# Patient Record
Sex: Male | Born: 1945 | Race: Black or African American | Hispanic: No | Marital: Married | Smoking: Never smoker
Health system: Southern US, Community
[De-identification: ages and names within clinical notes are randomized; demographics above are authoritative.]

## PROBLEM LIST (undated history)

## (undated) DIAGNOSIS — I10 Essential (primary) hypertension: Secondary | ICD-10-CM

## (undated) DIAGNOSIS — E119 Type 2 diabetes mellitus without complications: Secondary | ICD-10-CM

## (undated) DIAGNOSIS — N189 Chronic kidney disease, unspecified: Secondary | ICD-10-CM

## (undated) DIAGNOSIS — I739 Peripheral vascular disease, unspecified: Secondary | ICD-10-CM

## (undated) DIAGNOSIS — K922 Gastrointestinal hemorrhage, unspecified: Secondary | ICD-10-CM

## (undated) DIAGNOSIS — C61 Malignant neoplasm of prostate: Secondary | ICD-10-CM

## (undated) DIAGNOSIS — E785 Hyperlipidemia, unspecified: Secondary | ICD-10-CM

## (undated) DIAGNOSIS — I2510 Atherosclerotic heart disease of native coronary artery without angina pectoris: Secondary | ICD-10-CM

## (undated) HISTORY — PX: APPENDECTOMY: SHX54

## (undated) HISTORY — PX: CHOLECYSTECTOMY: SHX55

---

## 2019-11-05 ENCOUNTER — Inpatient Hospital Stay (HOSPITAL_COMMUNITY)
Admission: AD | Admit: 2019-11-05 | Discharge: 2019-12-10 | DRG: 870 | Disposition: E | Payer: Medicare Other | Source: Other Acute Inpatient Hospital | Attending: Internal Medicine | Admitting: Internal Medicine

## 2019-11-05 ENCOUNTER — Inpatient Hospital Stay (HOSPITAL_COMMUNITY): Payer: Medicare Other

## 2019-11-05 DIAGNOSIS — K449 Diaphragmatic hernia without obstruction or gangrene: Secondary | ICD-10-CM | POA: Diagnosis present

## 2019-11-05 DIAGNOSIS — J80 Acute respiratory distress syndrome: Secondary | ICD-10-CM | POA: Diagnosis present

## 2019-11-05 DIAGNOSIS — R34 Anuria and oliguria: Secondary | ICD-10-CM | POA: Diagnosis present

## 2019-11-05 DIAGNOSIS — Z515 Encounter for palliative care: Secondary | ICD-10-CM | POA: Diagnosis not present

## 2019-11-05 DIAGNOSIS — K922 Gastrointestinal hemorrhage, unspecified: Secondary | ICD-10-CM | POA: Diagnosis not present

## 2019-11-05 DIAGNOSIS — Z8546 Personal history of malignant neoplasm of prostate: Secondary | ICD-10-CM

## 2019-11-05 DIAGNOSIS — I2510 Atherosclerotic heart disease of native coronary artery without angina pectoris: Secondary | ICD-10-CM | POA: Diagnosis present

## 2019-11-05 DIAGNOSIS — N179 Acute kidney failure, unspecified: Secondary | ICD-10-CM | POA: Diagnosis present

## 2019-11-05 DIAGNOSIS — D631 Anemia in chronic kidney disease: Secondary | ICD-10-CM | POA: Diagnosis present

## 2019-11-05 DIAGNOSIS — I130 Hypertensive heart and chronic kidney disease with heart failure and stage 1 through stage 4 chronic kidney disease, or unspecified chronic kidney disease: Secondary | ICD-10-CM | POA: Diagnosis present

## 2019-11-05 DIAGNOSIS — E1165 Type 2 diabetes mellitus with hyperglycemia: Secondary | ICD-10-CM | POA: Diagnosis not present

## 2019-11-05 DIAGNOSIS — E870 Hyperosmolality and hypernatremia: Secondary | ICD-10-CM | POA: Diagnosis present

## 2019-11-05 DIAGNOSIS — J9600 Acute respiratory failure, unspecified whether with hypoxia or hypercapnia: Secondary | ICD-10-CM

## 2019-11-05 DIAGNOSIS — E1122 Type 2 diabetes mellitus with diabetic chronic kidney disease: Secondary | ICD-10-CM | POA: Diagnosis present

## 2019-11-05 DIAGNOSIS — J9601 Acute respiratory failure with hypoxia: Secondary | ICD-10-CM | POA: Diagnosis not present

## 2019-11-05 DIAGNOSIS — D62 Acute posthemorrhagic anemia: Secondary | ICD-10-CM | POA: Diagnosis not present

## 2019-11-05 DIAGNOSIS — I21A1 Myocardial infarction type 2: Secondary | ICD-10-CM | POA: Diagnosis present

## 2019-11-05 DIAGNOSIS — K209 Esophagitis, unspecified without bleeding: Secondary | ICD-10-CM | POA: Diagnosis present

## 2019-11-05 DIAGNOSIS — Z66 Do not resuscitate: Secondary | ICD-10-CM | POA: Diagnosis not present

## 2019-11-05 DIAGNOSIS — E872 Acidosis: Secondary | ICD-10-CM | POA: Diagnosis present

## 2019-11-05 DIAGNOSIS — Z01818 Encounter for other preprocedural examination: Secondary | ICD-10-CM

## 2019-11-05 DIAGNOSIS — J1282 Pneumonia due to coronavirus disease 2019: Secondary | ICD-10-CM | POA: Diagnosis present

## 2019-11-05 DIAGNOSIS — J982 Interstitial emphysema: Secondary | ICD-10-CM | POA: Diagnosis not present

## 2019-11-05 DIAGNOSIS — K254 Chronic or unspecified gastric ulcer with hemorrhage: Secondary | ICD-10-CM | POA: Diagnosis not present

## 2019-11-05 DIAGNOSIS — R578 Other shock: Secondary | ICD-10-CM | POA: Diagnosis not present

## 2019-11-05 DIAGNOSIS — I472 Ventricular tachycardia: Secondary | ICD-10-CM | POA: Diagnosis not present

## 2019-11-05 DIAGNOSIS — K7200 Acute and subacute hepatic failure without coma: Secondary | ICD-10-CM | POA: Diagnosis not present

## 2019-11-05 DIAGNOSIS — J939 Pneumothorax, unspecified: Secondary | ICD-10-CM

## 2019-11-05 DIAGNOSIS — E875 Hyperkalemia: Secondary | ICD-10-CM | POA: Diagnosis present

## 2019-11-05 DIAGNOSIS — N189 Chronic kidney disease, unspecified: Secondary | ICD-10-CM | POA: Diagnosis present

## 2019-11-05 DIAGNOSIS — E1151 Type 2 diabetes mellitus with diabetic peripheral angiopathy without gangrene: Secondary | ICD-10-CM | POA: Diagnosis present

## 2019-11-05 DIAGNOSIS — Z452 Encounter for adjustment and management of vascular access device: Secondary | ICD-10-CM

## 2019-11-05 DIAGNOSIS — Z794 Long term (current) use of insulin: Secondary | ICD-10-CM

## 2019-11-05 DIAGNOSIS — E876 Hypokalemia: Secondary | ICD-10-CM | POA: Diagnosis present

## 2019-11-05 DIAGNOSIS — E11649 Type 2 diabetes mellitus with hypoglycemia without coma: Secondary | ICD-10-CM | POA: Diagnosis not present

## 2019-11-05 DIAGNOSIS — I4891 Unspecified atrial fibrillation: Secondary | ICD-10-CM | POA: Diagnosis not present

## 2019-11-05 DIAGNOSIS — Z79899 Other long term (current) drug therapy: Secondary | ICD-10-CM

## 2019-11-05 DIAGNOSIS — Z9911 Dependence on respirator [ventilator] status: Secondary | ICD-10-CM | POA: Diagnosis not present

## 2019-11-05 DIAGNOSIS — U071 COVID-19: Secondary | ICD-10-CM | POA: Diagnosis present

## 2019-11-05 DIAGNOSIS — I5021 Acute systolic (congestive) heart failure: Secondary | ICD-10-CM | POA: Diagnosis present

## 2019-11-05 DIAGNOSIS — Z992 Dependence on renal dialysis: Secondary | ICD-10-CM | POA: Diagnosis not present

## 2019-11-05 DIAGNOSIS — Z7982 Long term (current) use of aspirin: Secondary | ICD-10-CM

## 2019-11-05 DIAGNOSIS — K250 Acute gastric ulcer with hemorrhage: Secondary | ICD-10-CM | POA: Diagnosis not present

## 2019-11-05 DIAGNOSIS — A4189 Other specified sepsis: Secondary | ICD-10-CM | POA: Diagnosis present

## 2019-11-05 DIAGNOSIS — I82409 Acute embolism and thrombosis of unspecified deep veins of unspecified lower extremity: Secondary | ICD-10-CM | POA: Diagnosis not present

## 2019-11-05 DIAGNOSIS — R0602 Shortness of breath: Secondary | ICD-10-CM | POA: Diagnosis not present

## 2019-11-05 DIAGNOSIS — K920 Hematemesis: Secondary | ICD-10-CM | POA: Diagnosis not present

## 2019-11-05 DIAGNOSIS — E785 Hyperlipidemia, unspecified: Secondary | ICD-10-CM | POA: Diagnosis present

## 2019-11-05 DIAGNOSIS — T380X5A Adverse effect of glucocorticoids and synthetic analogues, initial encounter: Secondary | ICD-10-CM | POA: Diagnosis not present

## 2019-11-05 HISTORY — DX: Atherosclerotic heart disease of native coronary artery without angina pectoris: I25.10

## 2019-11-05 HISTORY — DX: Chronic kidney disease, unspecified: N18.9

## 2019-11-05 HISTORY — DX: Gastrointestinal hemorrhage, unspecified: K92.2

## 2019-11-05 HISTORY — DX: Essential (primary) hypertension: I10

## 2019-11-05 HISTORY — DX: Hyperlipidemia, unspecified: E78.5

## 2019-11-05 HISTORY — DX: Peripheral vascular disease, unspecified: I73.9

## 2019-11-05 HISTORY — DX: Malignant neoplasm of prostate: C61

## 2019-11-05 HISTORY — DX: Type 2 diabetes mellitus without complications: E11.9

## 2019-11-05 LAB — CBC
HCT: 29.5 % — ABNORMAL LOW (ref 39.0–52.0)
Hemoglobin: 8.7 g/dL — ABNORMAL LOW (ref 13.0–17.0)
MCH: 28.2 pg (ref 26.0–34.0)
MCHC: 29.5 g/dL — ABNORMAL LOW (ref 30.0–36.0)
MCV: 95.8 fL (ref 80.0–100.0)
Platelets: 178 10*3/uL (ref 150–400)
RBC: 3.08 MIL/uL — ABNORMAL LOW (ref 4.22–5.81)
RDW: 16.8 % — ABNORMAL HIGH (ref 11.5–15.5)
WBC: 15.1 10*3/uL — ABNORMAL HIGH (ref 4.0–10.5)
nRBC: 0.3 % — ABNORMAL HIGH (ref 0.0–0.2)

## 2019-11-05 LAB — POCT I-STAT 7, (LYTES, BLD GAS, ICA,H+H)
Acid-base deficit: 6 mmol/L — ABNORMAL HIGH (ref 0.0–2.0)
Bicarbonate: 20 mmol/L (ref 20.0–28.0)
Calcium, Ion: 1.15 mmol/L (ref 1.15–1.40)
HCT: 26 % — ABNORMAL LOW (ref 39.0–52.0)
Hemoglobin: 8.8 g/dL — ABNORMAL LOW (ref 13.0–17.0)
O2 Saturation: 93 %
Patient temperature: 98.6
Potassium: 6.2 mmol/L — ABNORMAL HIGH (ref 3.5–5.1)
Sodium: 132 mmol/L — ABNORMAL LOW (ref 135–145)
TCO2: 21 mmol/L — ABNORMAL LOW (ref 22–32)
pCO2 arterial: 39.6 mmHg (ref 32.0–48.0)
pH, Arterial: 7.312 — ABNORMAL LOW (ref 7.350–7.450)
pO2, Arterial: 72 mmHg — ABNORMAL LOW (ref 83.0–108.0)

## 2019-11-05 LAB — COMPREHENSIVE METABOLIC PANEL
ALT: 14 U/L (ref 0–44)
AST: 28 U/L (ref 15–41)
Albumin: 2.1 g/dL — ABNORMAL LOW (ref 3.5–5.0)
Alkaline Phosphatase: 106 U/L (ref 38–126)
Anion gap: 10 (ref 5–15)
BUN: 70 mg/dL — ABNORMAL HIGH (ref 8–23)
CO2: 19 mmol/L — ABNORMAL LOW (ref 22–32)
Calcium: 7.7 mg/dL — ABNORMAL LOW (ref 8.9–10.3)
Chloride: 104 mmol/L (ref 98–111)
Creatinine, Ser: 2.92 mg/dL — ABNORMAL HIGH (ref 0.61–1.24)
GFR calc Af Amer: 24 mL/min — ABNORMAL LOW (ref >60)
GFR calc non Af Amer: 20 mL/min — ABNORMAL LOW (ref >60)
Glucose, Bld: 181 mg/dL — ABNORMAL HIGH (ref 70–99)
Potassium: 6.8 mmol/L (ref 3.5–5.1)
Sodium: 133 mmol/L — ABNORMAL LOW (ref 135–145)
Total Bilirubin: 0.9 mg/dL (ref 0.3–1.2)
Total Protein: 6 g/dL — ABNORMAL LOW (ref 6.5–8.1)

## 2019-11-05 LAB — HEMOGLOBIN A1C
Hgb A1c MFr Bld: 6.9 % — ABNORMAL HIGH (ref 4.8–5.6)
Mean Plasma Glucose: 151.33 mg/dL

## 2019-11-05 LAB — GLUCOSE, CAPILLARY
Glucose-Capillary: 184 mg/dL — ABNORMAL HIGH (ref 70–99)
Glucose-Capillary: 211 mg/dL — ABNORMAL HIGH (ref 70–99)

## 2019-11-05 LAB — BRAIN NATRIURETIC PEPTIDE: B Natriuretic Peptide: 166.7 pg/mL — ABNORMAL HIGH (ref 0.0–100.0)

## 2019-11-05 LAB — LACTIC ACID, PLASMA: Lactic Acid, Venous: 1.6 mmol/L (ref 0.5–1.9)

## 2019-11-05 LAB — PROCALCITONIN: Procalcitonin: 0.27 ng/mL

## 2019-11-05 LAB — PROTIME-INR
INR: 1.8 — ABNORMAL HIGH (ref 0.8–1.2)
Prothrombin Time: 20.4 seconds — ABNORMAL HIGH (ref 11.4–15.2)

## 2019-11-05 LAB — MAGNESIUM: Magnesium: 3.6 mg/dL — ABNORMAL HIGH (ref 1.7–2.4)

## 2019-11-05 LAB — D-DIMER, QUANTITATIVE: D-Dimer, Quant: 9.91 ug/mL-FEU — ABNORMAL HIGH (ref 0.00–0.50)

## 2019-11-05 LAB — APTT: aPTT: 36 seconds (ref 24–36)

## 2019-11-05 MED ORDER — ASCORBIC ACID 500 MG PO TABS
500.00 | ORAL_TABLET | ORAL | Status: DC
Start: 2019-11-06 — End: 2019-11-05

## 2019-11-05 MED ORDER — GEMFIBROZIL 600 MG PO TABS
600.00 | ORAL_TABLET | ORAL | Status: DC
Start: 2019-11-05 — End: 2019-11-05

## 2019-11-05 MED ORDER — DOCUSATE SODIUM 50 MG/5ML PO LIQD
100.0000 mg | Freq: Two times a day (BID) | ORAL | Status: DC | PRN
  Administered 2019-11-08: 100 mg
  Filled 2019-11-05: qty 10

## 2019-11-05 MED ORDER — INSULIN LISPRO 100 UNIT/ML ~~LOC~~ SOLN
1.00 | SUBCUTANEOUS | Status: DC
Start: ? — End: 2019-11-05

## 2019-11-05 MED ORDER — ORAL CARE MOUTH RINSE
15.0000 mL | OROMUCOSAL | Status: DC
  Administered 2019-11-06 – 2019-11-14 (×84): 15 mL via OROMUCOSAL

## 2019-11-05 MED ORDER — MIDAZOLAM HCL 2 MG/2ML IJ SOLN
1.0000 mg | INTRAMUSCULAR | Status: AC | PRN
  Administered 2019-11-05 – 2019-11-12 (×3): 1 mg via INTRAVENOUS
  Filled 2019-11-05 (×2): qty 2

## 2019-11-05 MED ORDER — FENTANYL CITRATE (PF) 100 MCG/2ML IJ SOLN
25.0000 ug | Freq: Once | INTRAMUSCULAR | Status: AC
  Administered 2019-11-05: 19:00:00 25 ug via INTRAVENOUS

## 2019-11-05 MED ORDER — AMLODIPINE BESYLATE 5 MG PO TABS
10.00 | ORAL_TABLET | ORAL | Status: DC
Start: 2019-11-06 — End: 2019-11-05

## 2019-11-05 MED ORDER — MIDAZOLAM HCL 2 MG/2ML IJ SOLN
1.0000 mg | INTRAMUSCULAR | Status: DC | PRN
  Administered 2019-11-12: 21:00:00 1 mg via INTRAVENOUS
  Filled 2019-11-05 (×2): qty 2

## 2019-11-05 MED ORDER — CHOLECALCIFEROL 25 MCG (1000 UT) PO TABS
1000.00 | ORAL_TABLET | ORAL | Status: DC
Start: 2019-11-06 — End: 2019-11-05

## 2019-11-05 MED ORDER — ATORVASTATIN CALCIUM 80 MG PO TABS
80.00 | ORAL_TABLET | ORAL | Status: DC
Start: 2019-11-05 — End: 2019-11-05

## 2019-11-05 MED ORDER — ASPIRIN 81 MG PO TBEC
81.00 | DELAYED_RELEASE_TABLET | ORAL | Status: DC
Start: 2019-11-06 — End: 2019-11-05

## 2019-11-05 MED ORDER — MAGNESIUM OXIDE 400 MG PO TABS
400.00 | ORAL_TABLET | ORAL | Status: DC
Start: ? — End: 2019-11-05

## 2019-11-05 MED ORDER — ZINC SULFATE 220 (50 ZN) MG PO CAPS
220.00 | ORAL_CAPSULE | ORAL | Status: DC
Start: 2019-11-06 — End: 2019-11-05

## 2019-11-05 MED ORDER — ACETAMINOPHEN 160 MG/5ML PO SOLN: 650.0000 mg | ORAL | Status: DC | PRN

## 2019-11-05 MED ORDER — DEXMEDETOMIDINE HCL IN NACL 400 MCG/100ML IV SOLN
0.20 | INTRAVENOUS | Status: DC
Start: ? — End: 2019-11-05

## 2019-11-05 MED ORDER — INSULIN LISPRO PROT & LISPRO (75-25 MIX) 100 UNIT/ML ~~LOC~~ SUSP
20.00 | SUBCUTANEOUS | Status: DC
Start: 2019-11-05 — End: 2019-11-05

## 2019-11-05 MED ORDER — DEXTROSE 50 % IV SOLN
12.50 | INTRAVENOUS | Status: DC
Start: ? — End: 2019-11-05

## 2019-11-05 MED ORDER — METHYLPREDNISOLONE SODIUM SUCC 40 MG IJ SOLR
40.0000 mg | Freq: Three times a day (TID) | INTRAMUSCULAR | Status: AC
  Administered 2019-11-05 – 2019-11-10 (×15): 40 mg via INTRAVENOUS
  Filled 2019-11-05 (×15): qty 1

## 2019-11-05 MED ORDER — ALBUTEROL SULFATE HFA 108 (90 BASE) MCG/ACT IN AERS
2.00 | INHALATION_SPRAY | RESPIRATORY_TRACT | Status: DC
Start: ? — End: 2019-11-05

## 2019-11-05 MED ORDER — APIXABAN 2.5 MG PO TABS
5.00 | ORAL_TABLET | ORAL | Status: DC
Start: 2019-11-05 — End: 2019-11-05

## 2019-11-05 MED ORDER — SALINE NASAL SPRAY 0.65 % NA SOLN
1.00 | NASAL | Status: DC
Start: ? — End: 2019-11-05

## 2019-11-05 MED ORDER — ENOXAPARIN SODIUM 30 MG/0.3ML ~~LOC~~ SOLN
30.0000 mg | SUBCUTANEOUS | Status: DC
  Administered 2019-11-05: 30 mg via SUBCUTANEOUS
  Filled 2019-11-05: qty 0.3

## 2019-11-05 MED ORDER — FENTANYL BOLUS VIA INFUSION
25.0000 ug | INTRAVENOUS | Status: DC | PRN
  Administered 2019-11-05 – 2019-11-12 (×5): 25 ug via INTRAVENOUS
  Filled 2019-11-05: qty 25

## 2019-11-05 MED ORDER — IPRATROPIUM-ALBUTEROL 20-100 MCG/ACT IN AERS
1.00 | INHALATION_SPRAY | RESPIRATORY_TRACT | Status: DC
Start: 2019-11-05 — End: 2019-11-05

## 2019-11-05 MED ORDER — CHLORHEXIDINE GLUCONATE CLOTH 2 % EX PADS
6.0000 | MEDICATED_PAD | Freq: Every day | CUTANEOUS | Status: DC
  Administered 2019-11-06 – 2019-11-13 (×8): 6 via TOPICAL

## 2019-11-05 MED ORDER — ACETAMINOPHEN 650 MG RE SUPP
650.00 | RECTAL | Status: DC
Start: ? — End: 2019-11-05

## 2019-11-05 MED ORDER — ACETAMINOPHEN 325 MG PO TABS: 650.0000 mg | ORAL_TABLET | ORAL | Status: DC | PRN

## 2019-11-05 MED ORDER — GLUCAGON (RDNA) 1 MG IJ KIT
1.00 | PACK | INTRAMUSCULAR | Status: DC
Start: ? — End: 2019-11-05

## 2019-11-05 MED ORDER — FENTANYL CITRATE-NACL 2.5-0.9 MG/250ML-% IV SOLN
25.00 | INTRAVENOUS | Status: DC
Start: ? — End: 2019-11-05

## 2019-11-05 MED ORDER — MORPHINE SULFATE (PF) 2 MG/ML IV SOLN
2.00 | INTRAVENOUS | Status: DC
Start: ? — End: 2019-11-05

## 2019-11-05 MED ORDER — CHLORHEXIDINE GLUCONATE 0.12% ORAL RINSE (MEDLINE KIT)
15.0000 mL | Freq: Two times a day (BID) | OROMUCOSAL | Status: DC
  Administered 2019-11-06 – 2019-11-14 (×18): 15 mL via OROMUCOSAL

## 2019-11-05 MED ORDER — PANTOPRAZOLE SODIUM 40 MG IV SOLR
40.00 | INTRAVENOUS | Status: DC
Start: 2019-11-06 — End: 2019-11-05

## 2019-11-05 MED ORDER — METHYLPREDNISOLONE SODIUM SUCC 125 MG IJ SOLR
125.00 | INTRAMUSCULAR | Status: DC
Start: 2019-11-05 — End: 2019-11-05

## 2019-11-05 MED ORDER — GENERIC EXTERNAL MEDICATION
12.50 | Status: DC
Start: ? — End: 2019-11-05

## 2019-11-05 MED ORDER — INSULIN ASPART 100 UNIT/ML ~~LOC~~ SOLN
0.0000 [IU] | SUBCUTANEOUS | Status: DC
  Administered 2019-11-06: 17:00:00 8 [IU] via SUBCUTANEOUS
  Administered 2019-11-06 (×2): 5 [IU] via SUBCUTANEOUS
  Administered 2019-11-06: 08:00:00 3 [IU] via SUBCUTANEOUS
  Administered 2019-11-06 (×2): 5 [IU] via SUBCUTANEOUS
  Administered 2019-11-06: 3 [IU] via SUBCUTANEOUS
  Administered 2019-11-07 (×3): 11 [IU] via SUBCUTANEOUS
  Administered 2019-11-07: 5 [IU] via SUBCUTANEOUS
  Administered 2019-11-07: 8 [IU] via SUBCUTANEOUS
  Administered 2019-11-08: 15 [IU] via SUBCUTANEOUS
  Administered 2019-11-08: 11 [IU] via SUBCUTANEOUS

## 2019-11-05 MED ORDER — CARVEDILOL 12.5 MG PO TABS
25.00 | ORAL_TABLET | ORAL | Status: DC
Start: 2019-11-05 — End: 2019-11-05

## 2019-11-05 MED ORDER — FENTANYL 2500MCG IN NS 250ML (10MCG/ML) PREMIX INFUSION
INTRAVENOUS | Status: AC
  Administered 2019-11-05: 19:00:00 50 ug/h via INTRAVENOUS
  Filled 2019-11-05: qty 250

## 2019-11-05 MED ORDER — MIDAZOLAM HCL 10 MG/10ML IJ SOLN
2.00 | INTRAMUSCULAR | Status: DC
Start: ? — End: 2019-11-05

## 2019-11-05 MED ORDER — FENTANYL 2500MCG IN NS 250ML (10MCG/ML) PREMIX INFUSION
0.0000 ug/h | INTRAVENOUS | Status: DC
  Administered 2019-11-07 – 2019-11-08 (×2): 200 ug/h via INTRAVENOUS
  Administered 2019-11-08 – 2019-11-09 (×2): 150 ug/h via INTRAVENOUS
  Administered 2019-11-10 – 2019-11-11 (×2): 125 ug/h via INTRAVENOUS
  Administered 2019-11-12: 23:00:00 400 ug/h via INTRAVENOUS
  Administered 2019-11-12: 05:00:00 125 ug/h via INTRAVENOUS
  Administered 2019-11-13: 06:00:00 275 ug/h via INTRAVENOUS
  Filled 2019-11-05 (×11): qty 250

## 2019-11-05 MED ORDER — FAMOTIDINE 40 MG/5ML PO SUSR
20.0000 mg | Freq: Two times a day (BID) | ORAL | Status: DC
  Administered 2019-11-05: 20 mg via ORAL
  Filled 2019-11-05: qty 2.5

## 2019-11-05 MED ORDER — ALUM & MAG HYDROXIDE-SIMETH 400-400-40 MG/5ML PO SUSP
30.00 | ORAL | Status: DC
Start: ? — End: 2019-11-05

## 2019-11-05 MED ORDER — POLYETHYLENE GLYCOL 3350 17 GM/SCOOP PO POWD
17.00 | ORAL | Status: DC
Start: 2019-11-06 — End: 2019-11-05

## 2019-11-05 MED ORDER — ONDANSETRON HCL 4 MG/2ML IJ SOLN
4.00 | INTRAMUSCULAR | Status: DC
Start: ? — End: 2019-11-05

## 2019-11-05 MED ORDER — INSULIN ASPART 100 UNIT/ML ~~LOC~~ SOLN
1.0000 [IU] | SUBCUTANEOUS | Status: DC
  Administered 2019-11-05: 20:00:00 3 [IU] via SUBCUTANEOUS

## 2019-11-05 MED ORDER — ACETAMINOPHEN 500 MG PO TABS
1000.00 | ORAL_TABLET | ORAL | Status: DC
Start: ? — End: 2019-11-05

## 2019-11-05 MED ORDER — METOPROLOL TARTRATE 5 MG/5ML IV SOLN
5.00 | INTRAVENOUS | Status: DC
Start: ? — End: 2019-11-05

## 2019-11-05 MED ORDER — MIDAZOLAM 50MG/50ML (1MG/ML) PREMIX INFUSION
0.0000 mg/h | INTRAVENOUS | Status: DC
  Administered 2019-11-06: 10:00:00 6 mg/h via INTRAVENOUS
  Administered 2019-11-06: 19:00:00 7 mg/h via INTRAVENOUS
  Administered 2019-11-06: 01:00:00 5 mg/h via INTRAVENOUS
  Administered 2019-11-07 (×4): 7 mg/h via INTRAVENOUS
  Administered 2019-11-08: 17:00:00 4 mg/h via INTRAVENOUS
  Administered 2019-11-08: 08:00:00 7 mg/h via INTRAVENOUS
  Administered 2019-11-09: 21:00:00 1 mg/h via INTRAVENOUS
  Administered 2019-11-09: 13:00:00 3 mg/h via INTRAVENOUS
  Filled 2019-11-05 (×11): qty 50

## 2019-11-05 MED ORDER — MIDAZOLAM 50MG/50ML (1MG/ML) PREMIX INFUSION
INTRAVENOUS | Status: AC
  Administered 2019-11-05: 19:00:00 2 mg/h via INTRAVENOUS
  Filled 2019-11-05: qty 50

## 2019-11-05 NOTE — Plan of Care (Signed)
  Problem: Respiratory: Goal: Will maintain a patent airway 10/18/2019 1937 by Angela Adam, RN Outcome: Not Progressing   Problem: Activity: Goal: Ability to tolerate increased activity will improve 10/21/2019 1938 by Angela Adam, RN Outcome: Progressing 11/03/2019 1937 by Angela Adam, RN Outcome: Not Progressing 10/30/2019 1936 by Angela Adam, RN Outcome: Not Progressing   Problem: Respiratory: Goal: Ability to maintain a clear airway and adequate ventilation will improve 10/19/2019 1938 by Angela Adam, RN Outcome: Progressing 11/06/2019 1937 by Angela Adam, RN Outcome: Not Progressing 11/02/2019 1936 by Angela Adam, RN Outcome: Not Progressing   Problem: Role Relationship: Goal: Method of communication will improve 10/15/2019 1938 by Angela Adam, RN Outcome: Progressing 10/19/2019 1937 by Angela Adam, RN Outcome: Progressing 10/15/2019 1936 by Angela Adam, RN Outcome: Not Progressing

## 2019-11-05 NOTE — H&P (Signed)
NAME:  Colton Fox, MRN:  [id number], DOB:  [birth year], LOS: 0 ADMISSION DATE:  10/31/2019, CONSULTATION DATE:  10/16/2019 REFERRING MD:  OSH, CHIEF COMPLAINT:  Acute hypoxemic resp failure 2/2 covid  Brief History   74 yo male with covid presented to OSH 1/13 after 4 days of feeling poorly, dx/d with covid and acute hypoxic resp failure, decompensated slowly to warrant ett placement 1/24. Transferred to [facility].   History of present illness   74 yo male with extensive pmh presented to osh on 1/13 after 4 days of feeling poorly (wife at home was dx'd with covid 19 on 1/9) upon presentation pt was found to be ypoxic to 43's. He was dx'd with covid infection and admitted for acute hypoxic resp failure. At that time he also had trop elevation and was ultimately seen by cardiology and was determined to be NSTEMI Type2 and no further intervention taken. During his hospital stay it appears the pt's oxygen requirement slowly increased and on 1/24 was   Past Medical History  No past medical history on file.  Significant Hospital Events   1/24: intubated at OSH 1/25 transferred to [place]:    Procedures:  1/24: intubated  Significant Diagnostic Tests:    Micro Data:  Blood 1/25 Urine 1/25 resp 1/25  Antimicrobials:    Interim history/subjective:    Objective   Blood pressure 137/75, pulse 79, temperature 97.8 F (36.6 C), temperature source Axillary, resp. rate 12, height 6\' 1"  (1.854 m), SpO2 95 %.    Vent Mode: PRVC FiO2 (%):  [80 %] 80 % Set Rate:  [20 bmp] 20 bmp Vt Set:  [560 mL] 560 mL PEEP:  [12 cmH20] 12 cmH20  No intake or output data in the 24 hours ending 10/30/2019 1835 There were no vitals filed for this visit.  Examination: General: sedated unresponsive but dysynchronous with vent.  HEENT: NCAT,  PERRLA/ pinpoint, MMMP Lungs: rhonchi bilaterally, no wheezes, Cardiovascular: RRR, no m/g/r Abdomen: soft, NT,ND, BS+ Extremities: + edema Skin: no rashes, warm  and dry Neuro: unresponsive on sedation   Resolved Hospital Problem list     Assessment & Plan:  Acute hypoxemic resp failure 2/2 covid pna:  S/p Decadron 6mg  daily x 10 days and was continued on solumedrol will cont for now S/p Remdesivir x 5 days Continue mechanical ventilation per ARDS protocol Target TVol 6-8cc/kgIBW Target Plateau Pressure < 30cm H20 Target driving pressure less than 15 cm of water Target PaO2 55-65: titrate PEEP/FiO2 per protocol Await abg for pf ratio and determine proning at that time.  Target CVP less than 4, diurese as necessary Ventilator associated pneumonia prevention protocol -Le dopplers with ddimer elevation.  Sepsis 2/2 above -as above  AKI:  -Cr 2.5 at osh -follow indices and uop -order renal u/s  -urine studies  Dm2:  -ssi -check a1c    Best practice:  Diet: npo, ordered tf Pain/Anxiety/Delirium protocol (if indicated): protocol VAP protocol (if indicated): yes DVT prophylaxis: lovenox GI prophylaxis: famotidine Glucose control: ssi Mobility: bedrest Code Status: full Family Communication:  Disposition: ICU  Labs   CBC: No results for input(s): WBC, NEUTROABS, HGB, HCT, MCV, PLT in the last 168 hours.  Basic Metabolic Panel: No results for input(s): NA, K, CL, CO2, GLUCOSE, BUN, CREATININE, CALCIUM, MG, PHOS in the last 168 hours. GFR: CrCl cannot be calculated (No successful lab value found.). No results for input(s): PROCALCITON, WBC, LATICACIDVEN in the last 168 hours.  Liver Function Tests: No results  for input(s): AST, ALT, ALKPHOS, BILITOT, PROT, ALBUMIN in the last 168 hours. No results for input(s): LIPASE, AMYLASE in the last 168 hours. No results for input(s): AMMONIA in the last 168 hours.  ABG No results found for: PHART, PCO2ART, PO2ART, HCO3, TCO2, ACIDBASEDEF, O2SAT   Coagulation Profile: No results for input(s): INR, PROTIME in the last 168 hours.  Cardiac Enzymes: No results for input(s):  CKTOTAL, CKMB, CKMBINDEX, TROPONINI in the last 168 hours.  HbA1C: No results found for: HGBA1C  CBG: No results for input(s): GLUCAP in the last 168 hours.  Review of Systems:   unobtained 2/2 intubation  Past Medical History  He,  has no past medical history on file.  Awaiting chart from osh  Surgical History   Awaiting chart from osh  Social History      Family History   His family history is not on file.   Allergies No Known Allergies   Home Medications  Prior to Admission medications   Not on File     Critical care time: The patient is critically ill with multiple organ systems failure and requires high complexity decision making for assessment and support, frequent evaluation and titration of therapies, application of advanced monitoring technologies and extensive interpretation of multiple databases.  Critical care time 46 mins. This represents my time independent of the NP's/PA's/med students/residents time taking care of the pt. This is excluding procedures.     [facility] 10/17/2019, 6:35 PM

## 2019-11-05 NOTE — Plan of Care (Signed)
  Problem: Education: Goal: Knowledge of risk factors and measures for prevention of condition will improve 10/14/2019 1937 by Angela Adam, RN Outcome: Progressing 11/04/2019 1936 by Angela Adam, RN Outcome: Not Progressing   Problem: Respiratory: Goal: Will maintain a patent airway 10/14/2019 1937 by Angela Adam, RN Outcome: Not Progressing 10/17/2019 1936 by Angela Adam, RN Outcome: Not Progressing Goal: Complications related to the disease process, condition or treatment will be avoided or minimized 11/11/2019 1937 by Angela Adam, RN Outcome: Not Progressing 10/16/2019 1936 by Angela Adam, RN Outcome: Not Progressing   Problem: Activity: Goal: Ability to tolerate increased activity will improve 11/10/2019 1937 by Angela Adam, RN Outcome: Not Progressing 10/14/2019 1936 by Angela Adam, RN Outcome: Not Progressing   Problem: Respiratory: Goal: Ability to maintain a clear airway and adequate ventilation will improve 11/11/2019 1937 by Angela Adam, RN Outcome: Not Progressing 10/17/2019 1936 by Angela Adam, RN Outcome: Not Progressing   Problem: Role Relationship: Goal: Method of communication will improve 11/02/2019 1937 by Angela Adam, RN Outcome: Progressing 10/31/2019 1936 by Angela Adam, RN Outcome: Not Progressing

## 2019-11-05 NOTE — Progress Notes (Signed)
Pt arrived from OSH. Current vent settings: 560/20/80%/+12. Pt with 8.5 ETT secured at 26cm at the lip. RT will continue to monitor.

## 2019-11-06 ENCOUNTER — Other Ambulatory Visit: Payer: Self-pay

## 2019-11-06 ENCOUNTER — Inpatient Hospital Stay (HOSPITAL_COMMUNITY): Payer: Medicare Other

## 2019-11-06 ENCOUNTER — Encounter (HOSPITAL_COMMUNITY): Payer: Self-pay | Admitting: Critical Care Medicine

## 2019-11-06 DIAGNOSIS — J80 Acute respiratory distress syndrome: Secondary | ICD-10-CM

## 2019-11-06 DIAGNOSIS — U071 COVID-19: Secondary | ICD-10-CM

## 2019-11-06 DIAGNOSIS — N179 Acute kidney failure, unspecified: Secondary | ICD-10-CM

## 2019-11-06 DIAGNOSIS — R0602 Shortness of breath: Secondary | ICD-10-CM

## 2019-11-06 LAB — CBC
HCT: 28.3 % — ABNORMAL LOW (ref 39.0–52.0)
Hemoglobin: 8.4 g/dL — ABNORMAL LOW (ref 13.0–17.0)
MCH: 28.4 pg (ref 26.0–34.0)
MCHC: 29.7 g/dL — ABNORMAL LOW (ref 30.0–36.0)
MCV: 95.6 fL (ref 80.0–100.0)
Platelets: 194 10*3/uL (ref 150–400)
RBC: 2.96 MIL/uL — ABNORMAL LOW (ref 4.22–5.81)
RDW: 16.5 % — ABNORMAL HIGH (ref 11.5–15.5)
WBC: 15 10*3/uL — ABNORMAL HIGH (ref 4.0–10.5)
nRBC: 0.5 % — ABNORMAL HIGH (ref 0.0–0.2)

## 2019-11-06 LAB — POCT I-STAT 7, (LYTES, BLD GAS, ICA,H+H)
Acid-base deficit: 3 mmol/L — ABNORMAL HIGH (ref 0.0–2.0)
Bicarbonate: 23.3 mmol/L (ref 20.0–28.0)
Calcium, Ion: 1.13 mmol/L — ABNORMAL LOW (ref 1.15–1.40)
HCT: 24 % — ABNORMAL LOW (ref 39.0–52.0)
Hemoglobin: 8.2 g/dL — ABNORMAL LOW (ref 13.0–17.0)
O2 Saturation: 99 %
Patient temperature: 98.6
Potassium: 5 mmol/L (ref 3.5–5.1)
Sodium: 138 mmol/L (ref 135–145)
TCO2: 25 mmol/L (ref 22–32)
pCO2 arterial: 44.6 mmHg (ref 32.0–48.0)
pH, Arterial: 7.326 — ABNORMAL LOW (ref 7.350–7.450)
pO2, Arterial: 131 mmHg — ABNORMAL HIGH (ref 83.0–108.0)

## 2019-11-06 LAB — BASIC METABOLIC PANEL
Anion gap: 10 (ref 5–15)
Anion gap: 12 (ref 5–15)
BUN: 80 mg/dL — ABNORMAL HIGH (ref 8–23)
BUN: 83 mg/dL — ABNORMAL HIGH (ref 8–23)
CO2: 20 mmol/L — ABNORMAL LOW (ref 22–32)
CO2: 19 mmol/L — ABNORMAL LOW (ref 22–32)
Calcium: 7.8 mg/dL — ABNORMAL LOW (ref 8.9–10.3)
Calcium: 7.9 mg/dL — ABNORMAL LOW (ref 8.9–10.3)
Chloride: 104 mmol/L (ref 98–111)
Chloride: 105 mmol/L (ref 98–111)
Creatinine, Ser: 3.3 mg/dL — ABNORMAL HIGH (ref 0.61–1.24)
Creatinine, Ser: 3.42 mg/dL — ABNORMAL HIGH (ref 0.61–1.24)
GFR calc Af Amer: 20 mL/min — ABNORMAL LOW (ref >60)
GFR calc Af Amer: 19 mL/min — ABNORMAL LOW (ref >60)
GFR calc non Af Amer: 18 mL/min — ABNORMAL LOW (ref >60)
GFR calc non Af Amer: 17 mL/min — ABNORMAL LOW (ref >60)
Glucose, Bld: 201 mg/dL — ABNORMAL HIGH (ref 70–99)
Glucose, Bld: 204 mg/dL — ABNORMAL HIGH (ref 70–99)
Potassium: 6.3 mmol/L (ref 3.5–5.1)
Potassium: 5.9 mmol/L — ABNORMAL HIGH (ref 3.5–5.1)
Sodium: 134 mmol/L — ABNORMAL LOW (ref 135–145)
Sodium: 136 mmol/L (ref 135–145)

## 2019-11-06 LAB — ECHOCARDIOGRAM COMPLETE
Height: 73 in
Weight: 4366.87 oz

## 2019-11-06 LAB — LACTIC ACID, PLASMA: Lactic Acid, Venous: 1.6 mmol/L (ref 0.5–1.9)

## 2019-11-06 LAB — URINALYSIS, ROUTINE W REFLEX MICROSCOPIC
Bilirubin Urine: NEGATIVE
Glucose, UA: NEGATIVE mg/dL
Ketones, ur: NEGATIVE mg/dL
Nitrite: NEGATIVE
Protein, ur: 100 mg/dL — AB
RBC / HPF: >50 RBC/hpf — ABNORMAL HIGH (ref 0–5)
Specific Gravity, Urine: 1.014 (ref 1.005–1.030)
pH: 5 (ref 5.0–8.0)

## 2019-11-06 LAB — TYPE AND SCREEN
ABO/RH(D): A POS
Antibody Screen: NEGATIVE

## 2019-11-06 LAB — MRSA PCR SCREENING: MRSA by PCR: NEGATIVE

## 2019-11-06 LAB — GLUCOSE, CAPILLARY
Glucose-Capillary: 190 mg/dL — ABNORMAL HIGH (ref 70–99)
Glucose-Capillary: 191 mg/dL — ABNORMAL HIGH (ref 70–99)
Glucose-Capillary: 208 mg/dL — ABNORMAL HIGH (ref 70–99)
Glucose-Capillary: 219 mg/dL — ABNORMAL HIGH (ref 70–99)
Glucose-Capillary: 221 mg/dL — ABNORMAL HIGH (ref 70–99)
Glucose-Capillary: 224 mg/dL — ABNORMAL HIGH (ref 70–99)
Glucose-Capillary: 273 mg/dL — ABNORMAL HIGH (ref 70–99)

## 2019-11-06 LAB — PHOSPHORUS
Phosphorus: 7.4 mg/dL — ABNORMAL HIGH (ref 2.5–4.6)
Phosphorus: 7.5 mg/dL — ABNORMAL HIGH (ref 2.5–4.6)

## 2019-11-06 LAB — SODIUM, URINE, RANDOM: Sodium, Ur: <10 mmol/L

## 2019-11-06 LAB — PROTEIN / CREATININE RATIO, URINE
Creatinine, Urine: 147.64 mg/dL
Protein Creatinine Ratio: 0.55 mg/mg{Cre} — ABNORMAL HIGH (ref 0.00–0.15)
Total Protein, Urine: 81 mg/dL

## 2019-11-06 LAB — MAGNESIUM
Magnesium: 3.7 mg/dL — ABNORMAL HIGH (ref 1.7–2.4)
Magnesium: 3.9 mg/dL — ABNORMAL HIGH (ref 1.7–2.4)
Magnesium: 3.8 mg/dL — ABNORMAL HIGH (ref 1.7–2.4)

## 2019-11-06 LAB — ABO/RH: ABO/RH(D): A POS

## 2019-11-06 LAB — STREP PNEUMONIAE URINARY ANTIGEN: Strep Pneumo Urinary Antigen: NEGATIVE

## 2019-11-06 MED ORDER — CALCIUM GLUCONATE-NACL 1-0.675 GM/50ML-% IV SOLN
1.0000 g | Freq: Once | INTRAVENOUS | Status: AC
  Administered 2019-11-06: 15:00:00 1000 mg via INTRAVENOUS
  Filled 2019-11-06: qty 50

## 2019-11-06 MED ORDER — STERILE WATER FOR INJECTION IV SOLN
INTRAVENOUS | Status: DC
  Filled 2019-11-06 (×4): qty 850

## 2019-11-06 MED ORDER — NEPRO/CARBSTEADY PO LIQD
1000.0000 mL | ORAL | Status: DC
  Administered 2019-11-07: 1000 mL
  Filled 2019-11-06 (×4): qty 1000

## 2019-11-06 MED ORDER — PRO-STAT SUGAR FREE PO LIQD
60.0000 mL | Freq: Four times a day (QID) | ORAL | Status: DC
  Administered 2019-11-06 – 2019-11-08 (×6): 60 mL
  Filled 2019-11-06 (×5): qty 60

## 2019-11-06 MED ORDER — ALBUMIN HUMAN 25 % IV SOLN
25.0000 g | Freq: Once | INTRAVENOUS | Status: AC
  Administered 2019-11-06: 05:00:00 25 g via INTRAVENOUS
  Filled 2019-11-06: qty 50

## 2019-11-06 MED ORDER — INSULIN ASPART 100 UNIT/ML ~~LOC~~ SOLN
3.0000 [IU] | SUBCUTANEOUS | Status: DC
  Administered 2019-11-06 – 2019-11-07 (×6): 3 [IU] via SUBCUTANEOUS

## 2019-11-06 MED ORDER — SODIUM POLYSTYRENE SULFONATE 15 GM/60ML PO SUSP
30.0000 g | Freq: Four times a day (QID) | ORAL | Status: AC
  Administered 2019-11-06 (×2): 30 g
  Filled 2019-11-06 (×3): qty 120

## 2019-11-06 MED ORDER — SODIUM POLYSTYRENE SULFONATE 15 GM/60ML PO SUSP
30.0000 g | Freq: Four times a day (QID) | ORAL | Status: AC
  Administered 2019-11-06 (×2): 30 g
  Filled 2019-11-06 (×2): qty 120

## 2019-11-06 MED ORDER — SODIUM ZIRCONIUM CYCLOSILICATE 10 G PO PACK
10.0000 g | PACK | Freq: Once | ORAL | Status: AC
  Administered 2019-11-07: 10 g via ORAL
  Filled 2019-11-06 (×2): qty 1

## 2019-11-06 MED ORDER — FAMOTIDINE 40 MG/5ML PO SUSR
20.0000 mg | Freq: Every day | ORAL | Status: DC
  Administered 2019-11-06 – 2019-11-10 (×5): 20 mg
  Filled 2019-11-06 (×5): qty 2.5

## 2019-11-06 MED ORDER — SODIUM BICARBONATE 8.4 % IV SOLN
50.0000 meq | Freq: Once | INTRAVENOUS | Status: AC
  Administered 2019-11-06: 15:00:00 50 meq via INTRAVENOUS
  Filled 2019-11-06: qty 50

## 2019-11-06 MED ORDER — VITAL HIGH PROTEIN PO LIQD
1000.0000 mL | ORAL | Status: DC
  Administered 2019-11-06: 1000 mL

## 2019-11-06 MED ORDER — VITAL HIGH PROTEIN PO LIQD: 1000.0000 mL | ORAL | Status: DC

## 2019-11-06 MED ORDER — SODIUM ZIRCONIUM CYCLOSILICATE 10 G PO PACK
10.0000 g | PACK | Freq: Once | ORAL | Status: AC
  Administered 2019-11-06: 12:00:00 10 g via ORAL
  Filled 2019-11-06: qty 1

## 2019-11-06 MED ORDER — VITAL HIGH PROTEIN PO LIQD
1000.0000 mL | ORAL | Status: DC
  Administered 2019-11-06: 15:00:00 1000 mL

## 2019-11-06 MED ORDER — INSULIN ASPART 100 UNIT/ML IV SOLN
10.0000 [IU] | Freq: Once | INTRAVENOUS | Status: AC
  Administered 2019-11-06: 10 [IU] via INTRAVENOUS

## 2019-11-06 MED ORDER — DEXTROSE 50 % IV SOLN
1.0000 | Freq: Once | INTRAVENOUS | Status: AC
  Administered 2019-11-06: 15:00:00 50 mL via INTRAVENOUS
  Filled 2019-11-06: qty 50

## 2019-11-06 MED ORDER — HEPARIN SODIUM (PORCINE) 10000 UNIT/ML IJ SOLN
10000.0000 [IU] | Freq: Three times a day (TID) | INTRAMUSCULAR | Status: DC
  Administered 2019-11-06 – 2019-11-10 (×13): 10000 [IU] via SUBCUTANEOUS
  Filled 2019-11-06 (×13): qty 1

## 2019-11-06 MED ORDER — PRO-STAT SUGAR FREE PO LIQD
30.0000 mL | Freq: Two times a day (BID) | ORAL | Status: DC
  Administered 2019-11-06: 12:00:00 30 mL

## 2019-11-06 MED ORDER — PRO-STAT SUGAR FREE PO LIQD: 60.0000 mL | Freq: Three times a day (TID) | ORAL | Status: DC

## 2019-11-06 NOTE — Progress Notes (Signed)
NAME:  Colton Fox, MRN:  [id number], DOB:  [birth year], LOS: 1 ADMISSION DATE:  10/12/2019, CONSULTATION DATE:  11/04/2019 REFERRING MD:  OSH, CHIEF COMPLAINT:  Acute hypoxemic resp failure 2/2 COVID  Brief History   74 y/o male, COVID positive who presented to OSH 1/13 after 4 days of feeling poorly, dx/d with COVID and acute hypoxic resp failure, NSTEMI and AKI on CKD.  He decompensated slowly to warrant ETT placement 1/24. Transferred to [facility] 1/25.   Past Medical History  DM  CKD  HTN HLD CAD s/p PCI PAD GIB Appendectomy Prostate Cancer  Significant Hospital Events   1/13 Admit to OSH with 4 day hx of feeling poorly, COVID + 1/24 Intubated at OSH  1/25 Tx to [facility], prone positioning   Consults:    Procedures:  ETT 1/24 >>   Significant Diagnostic Tests:  Renal US 1/26 >>  LE Venous Duplex 1/26 >> ECHO 1/26 >>    Micro Data:  BCx2 1/25 >>  MRSA PCXR 1/25 >> negative  UC 1/25 >>  U. Strep Antigen 1/26 >> negative  Tracheal aspirate 1/25 >>  Antimicrobials:    Interim history/subjective:  Afebrile  I/O - 46ml UOP, neg 376 in last 24 hours PEEP 12, FiO2 80%,  Glucose Range 180-210 RN reports hyperkalemia s/p kayexalate with large BM this am.  Difficult lab stick  Remains on versed, fentanyl   Objective   Blood pressure 135/78, pulse 86, temperature 97.7 F (36.5 C), resp. rate (!) 34, height 6\' 1"  (1.854 m), weight 123.8 kg, SpO2 98 %.    Vent Mode: PRVC FiO2 (%):  [80 %] 80 % Set Rate:  [20 bmp] 20 bmp Vt Set:  [560 mL] 560 mL PEEP:  [12 cmH20] 12 cmH20 Plateau Pressure:  [25 cmH20-30 cmH20] 25 cmH20   Intake/Output Summary (Last 24 hours) at 11/06/2019 1155 Last data filed at 11/06/2019 0258 Gross per 24 hour  Intake 200.33 ml  Output 895 ml  Net -694.67 ml   Filed Weights   10/12/2019 1800 11/08/2019 2000 11/06/19 0752  Weight: 122.5 kg 122.4 kg 123.8 kg    Examination: General: adult male lying in bed in NAD, critically ill appearing, prone  position  HEENT: MM pink/moist, ETT Neuro: sedate  CV: s1s2 RRR, no m/r/g PULM: non-labored on vent, lungs bilaterally with rhonchi GI: soft, bsx4 active  Extremities: warm/dry, trace edema  Skin: no rashes or lesions  CXR 1/26 >> images personally reviewed, bibasilar airspace disease, ETT in good position  Resolved Hospital Problem list     Assessment & Plan:   Acute Hypoxemic Respiratory Failure secondary to COVID PNA  ARDS  Completed decadron 6mg  x10 days + additional solumedrol, remdesivir x5 days -low Vt ventilation 4-8cc/kg -goal plateau pressure <30, driving pressure <52 cm H2O -target PaO2 55-65, titrate PEEP/FiO2 per ARDS protocol  -P/F ratio <150, prone therapy for 16 hours per day -goal CVP <4, diuresis as necessary -VAP prevention measures  -follow intermittent CXR  -solumedrol 40 mg IV Q8 -assess LE venous doppler with elevated D-Dimer  AKI on CKD  Sr Cr 2.5 at OSH.  Unclear baseline but was 1.67 in 2013.  FENa suggestive of pre-renal failure. Note proteinuria on UA.  -Trend BMP / urinary output -Replace electrolytes as indicated -Avoid nephrotoxic agents, ensure adequate renal perfusion -await renal US  -may need Nephrology evaluation   Hyperkalemia S/P kayexalate -now lokelma -now sodium bicarbonate, dextrose, insulin, calcium  -repeat BMP after  DM II  A1c 6.9 -  SSI, moderate scale  -add TF coverage Q4  Hx HTN, HLD, CAD s/p PCI, PAD -hold home agents -ICU monitoring  -assess EKG   Best practice:  Diet: TF Pain/Anxiety/Delirium protocol (if indicated): protocol VAP protocol (if indicated): yes DVT prophylaxis: lovenox GI prophylaxis: famotidine Glucose control: SSI Mobility: bedrest Code Status: Full Code  Family Communication: Wife Arville Go) called for update 1/26 at [phone] (number in chart gave a busy tone).  Disposition: ICU  Labs   CBC: Recent Labs  Lab 10/28/2019 1935 11/02/2019 2133 11/06/19 0430  WBC 15.1*  --  15.0*  HGB  8.7* 8.8* 8.4*  HCT 29.5* 26.0* 28.3*  MCV 95.8  --  95.6  PLT 178  --  578    Basic Metabolic Panel: Recent Labs  Lab 11/04/2019 1935 10/14/2019 2133 11/06/19 0430  NA 133* 132* 134*  K 6.8* 6.2* 6.3*  CL 104  --  104  CO2 19*  --  20*  GLUCOSE 181*  --  201*  BUN 70*  --  80*  CREATININE 2.92*  --  3.30*  CALCIUM 7.7*  --  7.8*  MG 3.6*  --  3.7*   GFR: Estimated Creatinine Clearance: 27.5 mL/min (A) (by C-G formula based on SCr of 3.3 mg/dL (H)). Recent Labs  Lab 10/25/2019 1935 10/29/2019 2320 11/06/19 0430  PROCALCITON 0.27  --   --   WBC 15.1*  --  15.0*  LATICACIDVEN 1.6 1.6  --     Liver Function Tests: Recent Labs  Lab 11/03/2019 1935  AST 28  ALT 14  ALKPHOS 106  BILITOT 0.9  PROT 6.0*  ALBUMIN 2.1*   No results for input(s): LIPASE, AMYLASE in the last 168 hours. No results for input(s): AMMONIA in the last 168 hours.  ABG    Component Value Date/Time   PHART 7.312 (L) 10/16/2019 2133   PCO2ART 39.6 10/16/2019 2133   PO2ART 72.0 (L) 11/01/2019 2133   HCO3 20.0 11/01/2019 2133   TCO2 21 (L) 11/11/2019 2133   ACIDBASEDEF 6.0 (H) 10/25/2019 2133   O2SAT 93.0 10/27/2019 2133     Coagulation Profile: Recent Labs  Lab 10/18/2019 1935  INR 1.8*    Cardiac Enzymes: No results for input(s): CKTOTAL, CKMB, CKMBINDEX, TROPONINI in the last 168 hours.  HbA1C: Hgb A1c MFr Bld  Date/Time Value Ref Range Status  10/29/2019 07:35 PM 6.9 (H) 4.8 - 5.6 % Final    Comment:    (NOTE) Pre diabetes:          5.7%-6.4% Diabetes:              >6.4% Glycemic control for   <7.0% adults with diabetes     CBG: Recent Labs  Lab 10/30/2019 1853 10/14/2019 2001 11/06/19 0012 11/06/19 0400 11/06/19 0735  GLUCAP 184* 211* 224* 191* 190*     Critical care time:  51 minutes    Noe Gens, MSN, NP-C [facility] 11/06/2019, 11:56 AM   Please see [email or web address] for pager details.

## 2019-11-06 NOTE — Progress Notes (Signed)
[place] Progress Note Patient Name: Colton Fox DOB: 1946-08-16 MRN: [id number]   Date of Service  11/06/2019  HPI/Events of Note  K+ 6.8  eICU Interventions  Kayexalate 30 gm via OGT Q 6 hours x 2 doses        Alora Gorey U Yoko Mcgahee 11/06/2019, 12:10 AM

## 2019-11-06 NOTE — Progress Notes (Signed)
  Echocardiogram 2D Echocardiogram has been performed.  Colton Fox 11/06/2019, 11:12 AM

## 2019-11-06 NOTE — Plan of Care (Signed)
  Problem: Education: Goal: Knowledge of risk factors and measures for prevention of condition will improve Outcome: Progressing   Problem: Coping: Goal: Psychosocial and spiritual needs will be supported Outcome: Progressing   Problem: Respiratory: Goal: Will maintain a patent airway Outcome: Progressing Goal: Complications related to the disease process, condition or treatment will be avoided or minimized Outcome: Progressing   Problem: Activity: Goal: Ability to tolerate increased activity will improve Outcome: Progressing   Problem: Respiratory: Goal: Ability to maintain a clear airway and adequate ventilation will improve Outcome: Progressing   Problem: Role Relationship: Goal: Method of communication will improve Outcome: Progressing

## 2019-11-06 NOTE — Progress Notes (Signed)
Inpatient Diabetes Program Recommendations  AACE/ADA: New Consensus Statement on Inpatient Glycemic Control (2015)  Target Ranges:  Prepandial:   less than 140 mg/dL      Peak postprandial:   less than 180 mg/dL (1-2 hours)      Critically ill patients:  140 - 180 mg/dL   Lab Results  Component Value Date   GLUCAP 190 (H) 11/06/2019   HGBA1C 6.9 (H) 10/23/2019    Review of Glycemic Control Results for SELDEN, NOTEBOOM (MRN [id number]) as of 11/06/2019 10:35  Ref. Range 10/22/2019 18:53 10/29/2019 20:01 11/06/2019 00:12 11/06/2019 04:00 11/06/2019 07:35  Glucose-Capillary Latest Ref Range: 70 - 99 mg/dL 184 (H) 211 (H) 224 (H) 191 (H) 190 (H)   Diabetes history: DM 2 Outpatient Diabetes medications: 75/25 35 units bid Current orders for Inpatient glycemic control:  Novolog 0-15 units Q4 hours  A1c 6.9% on 1/25 Solumedrol 40 mg Q8 hours BUN/Creat: 80/3.30 Starting Vital HP 40 ml/hour  Intubated 1/24  Inpatient Diabetes Program Recommendations:    Consider Novolog 4 units Q4 hours while on tube feeds with parameters (do not give if Tube Feeds are stopped or held).  Thanks,  Tama Headings RN, MSN, BC-ADM Inpatient Diabetes Coordinator Team Pager [phone] (8a-5p)

## 2019-11-06 NOTE — Progress Notes (Signed)
[place] Progress Note Patient Name: Colton Fox DOB: 1945/12/22 MRN: [id number]   Date of Service  11/06/2019  HPI/Events of Note  Oliguria  eICU Interventions  Albumin 25 % 25 gm iv x 1        Jaan Fischel U Syanna Remmert 11/06/2019, 4:09 AM

## 2019-11-06 NOTE — Progress Notes (Signed)
Initial Nutrition Assessment  DOCUMENTATION CODES:   Obesity unspecified  INTERVENTION:   Transition pt to Nepro @ 40 ml/hr (960 ml/day) via OG tube 60 ml Prostat QID  Provides: 2528 kcal, 197 grams protein, 697 ml free water, 911 mg potassium, and 1088 mg phosphorus.    NUTRITION DIAGNOSIS:   Increased nutrient needs related to acute illness(COVID-19) as evidenced by estimated needs.  GOAL:   Patient will meet greater than or equal to 90% of their needs  MONITOR:   TF tolerance, Labs  REASON FOR ASSESSMENT:   Consult, Ventilator Enteral/tube feeding initiation and management  ASSESSMENT:   Pt with PMH of DM, CKD, HTN, HLD, CAD, PAD, GIB who presented to OSH 1/13 for hypoxia dx with COVID-19. Required intubation 1/24 and tx to [facility] 1/25.   Pt has completed decadron and remdesivir Per MD pt may need RRT  Patient is currently intubated on ventilator support MV: 14.9 L/min Temp (24hrs), Avg:97.4 F (36.3 C), Min:96.9 F (36.1 C), Max:97.8 F (36.6 C)  Medications reviewed and include: SSI, 3 units novolog every 4 hours, solumedrol, kayexalate Nabicarb 50 ml/hr  Labs reviewed: K+ 5.9 (H), PO4: 7.4 (H), magnesium 3.9 (H)  TF: Vital High Protein @ 40 ml/hr with 30 ml Prostat BID Provides: 1160 kcal and 114 grams protein   NUTRITION - FOCUSED PHYSICAL EXAM:  Deferred   Diet Order:   Diet Order            Diet NPO time specified  Diet effective now              EDUCATION NEEDS:   No education needs have been identified at this time  Skin:  Skin Assessment: Reviewed RN Assessment  Last BM:  1/26  Height:   Ht Readings from Last 1 Encounters:  10/14/2019 6\' 1"  (1.854 m)    Weight:   Wt Readings from Last 1 Encounters:  11/06/19 123.8 kg    Ideal Body Weight:  83.6 kg  BMI:  Body mass index is 36.01 kg/m.  Estimated Nutritional Needs:   Kcal:  3704-8889  Protein:  167-209 grams  Fluid:  2 L/day  Maylon Peppers RD, LDN, CNSC [phone]  Pager [phone] After Hours Pager

## 2019-11-06 NOTE — Progress Notes (Signed)
Patient's head repositioned and arms rotated.  Patient tolerated well.  No skin breakdown noted at this time.    

## 2019-11-06 NOTE — Progress Notes (Signed)
Patient placed in supine position.  ETT secured.  No breakdown noted.  Patient tolerated well with no complications. 

## 2019-11-07 ENCOUNTER — Encounter (HOSPITAL_COMMUNITY): Payer: Self-pay | Admitting: Critical Care Medicine

## 2019-11-07 ENCOUNTER — Inpatient Hospital Stay (HOSPITAL_COMMUNITY): Payer: Medicare Other

## 2019-11-07 ENCOUNTER — Encounter (HOSPITAL_COMMUNITY): Payer: PRIVATE HEALTH INSURANCE

## 2019-11-07 DIAGNOSIS — J9601 Acute respiratory failure with hypoxia: Secondary | ICD-10-CM

## 2019-11-07 LAB — LEGIONELLA PNEUMOPHILA SEROGP 1 UR AG: L. pneumophila Serogp 1 Ur Ag: NEGATIVE

## 2019-11-07 LAB — CBC
HCT: 24.5 % — ABNORMAL LOW (ref 39.0–52.0)
Hemoglobin: 7.3 g/dL — ABNORMAL LOW (ref 13.0–17.0)
MCH: 28.9 pg (ref 26.0–34.0)
MCHC: 29.8 g/dL — ABNORMAL LOW (ref 30.0–36.0)
MCV: 96.8 fL (ref 80.0–100.0)
Platelets: 177 10*3/uL (ref 150–400)
RBC: 2.53 MIL/uL — ABNORMAL LOW (ref 4.22–5.81)
RDW: 16.7 % — ABNORMAL HIGH (ref 11.5–15.5)
WBC: 12.4 10*3/uL — ABNORMAL HIGH (ref 4.0–10.5)
nRBC: 0.8 % — ABNORMAL HIGH (ref 0.0–0.2)

## 2019-11-07 LAB — BASIC METABOLIC PANEL
Anion gap: 11 (ref 5–15)
Anion gap: 11 (ref 5–15)
BUN: 92 mg/dL — ABNORMAL HIGH (ref 8–23)
BUN: 92 mg/dL — ABNORMAL HIGH (ref 8–23)
CO2: 24 mmol/L (ref 22–32)
CO2: 26 mmol/L (ref 22–32)
Calcium: 7.5 mg/dL — ABNORMAL LOW (ref 8.9–10.3)
Calcium: 7.5 mg/dL — ABNORMAL LOW (ref 8.9–10.3)
Chloride: 105 mmol/L (ref 98–111)
Chloride: 104 mmol/L (ref 98–111)
Creatinine, Ser: 3.79 mg/dL — ABNORMAL HIGH (ref 0.61–1.24)
Creatinine, Ser: 3.82 mg/dL — ABNORMAL HIGH (ref 0.61–1.24)
GFR calc Af Amer: 17 mL/min — ABNORMAL LOW (ref >60)
GFR calc Af Amer: 17 mL/min — ABNORMAL LOW (ref >60)
GFR calc non Af Amer: 15 mL/min — ABNORMAL LOW (ref >60)
GFR calc non Af Amer: 15 mL/min — ABNORMAL LOW (ref >60)
Glucose, Bld: 242 mg/dL — ABNORMAL HIGH (ref 70–99)
Glucose, Bld: 225 mg/dL — ABNORMAL HIGH (ref 70–99)
Potassium: 4.8 mmol/L (ref 3.5–5.1)
Potassium: 4.6 mmol/L (ref 3.5–5.1)
Sodium: 140 mmol/L (ref 135–145)
Sodium: 141 mmol/L (ref 135–145)

## 2019-11-07 LAB — POCT I-STAT 7, (LYTES, BLD GAS, ICA,H+H)
Acid-Base Excess: 4 mmol/L — ABNORMAL HIGH (ref 0.0–2.0)
Bicarbonate: 30.5 mmol/L — ABNORMAL HIGH (ref 20.0–28.0)
Calcium, Ion: 1.07 mmol/L — ABNORMAL LOW (ref 1.15–1.40)
HCT: 22 % — ABNORMAL LOW (ref 39.0–52.0)
Hemoglobin: 7.5 g/dL — ABNORMAL LOW (ref 13.0–17.0)
O2 Saturation: 99 %
Patient temperature: 98.7
Potassium: 3.9 mmol/L (ref 3.5–5.1)
Sodium: 141 mmol/L (ref 135–145)
TCO2: 32 mmol/L (ref 22–32)
pCO2 arterial: 53.7 mmHg — ABNORMAL HIGH (ref 32.0–48.0)
pH, Arterial: 7.362 (ref 7.350–7.450)
pO2, Arterial: 135 mmHg — ABNORMAL HIGH (ref 83.0–108.0)

## 2019-11-07 LAB — GLUCOSE, CAPILLARY
Glucose-Capillary: 231 mg/dL — ABNORMAL HIGH (ref 70–99)
Glucose-Capillary: 289 mg/dL — ABNORMAL HIGH (ref 70–99)
Glucose-Capillary: 304 mg/dL — ABNORMAL HIGH (ref 70–99)
Glucose-Capillary: 311 mg/dL — ABNORMAL HIGH (ref 70–99)
Glucose-Capillary: 315 mg/dL — ABNORMAL HIGH (ref 70–99)

## 2019-11-07 LAB — URINE CULTURE: Culture: NO GROWTH

## 2019-11-07 LAB — PHOSPHORUS
Phosphorus: 7.6 mg/dL — ABNORMAL HIGH (ref 2.5–4.6)
Phosphorus: 8.7 mg/dL — ABNORMAL HIGH (ref 2.5–4.6)

## 2019-11-07 LAB — MAGNESIUM
Magnesium: 3.7 mg/dL — ABNORMAL HIGH (ref 1.7–2.4)
Magnesium: 2.6 mg/dL — ABNORMAL HIGH (ref 1.7–2.4)

## 2019-11-07 MED ORDER — INSULIN ASPART 100 UNIT/ML ~~LOC~~ SOLN
5.0000 [IU] | SUBCUTANEOUS | Status: DC
  Administered 2019-11-07 – 2019-11-08 (×7): 5 [IU] via SUBCUTANEOUS

## 2019-11-07 MED ORDER — FUROSEMIDE 10 MG/ML IJ SOLN
80.0000 mg | Freq: Two times a day (BID) | INTRAMUSCULAR | Status: DC
  Administered 2019-11-07 – 2019-11-08 (×2): 80 mg via INTRAVENOUS
  Filled 2019-11-07 (×2): qty 8

## 2019-11-07 NOTE — Consult Note (Addendum)
Renal Service Consult Note [facility]  Colton Fox 11/07/2019 [place] Requesting Physician:  Dr Ruthann Cancer  Reason for Consult:  Renal failure HPI: The patient is a 74 y.o. year-old w/ hx of HTN, HL, PAD, prostate Ca, IDDM, CKD, CAD presented on 11/11/2019 w/ SOB and hypoxic acute resp failure on 1/13 , was COVID+ at outside hospital and was admitted and rec'd usual Rx w/ IV remdesivir and decadron. Was transferred to [facility] hospital on 1/25 for ICU admission.  Creat on 1/25 here was 2.92 w/ BUN 70.  Creat up yest and today to BUN 92 and creat 3.82. Creat at outside hosp was 1.9- 2.5.   No old creat available.   Asked to see for renal failure.   Since arrival at [facility], BP's have been stable, no pressors needed. UOP is 400- 700 cc/day.  Total I/O are 2.4 L in and 2.4l L out for net of zero.  Wt's 122 > 124 kg here since admit.  Na bicarb gtt started yest running at 50/hr.     ROS n/a  Past Medical History  Past Medical History:  Diagnosis Date  . CAD (coronary artery disease)    s/p PCI   . CKD (chronic kidney disease)    Suspected CKD III  . Diabetes ([place])   . GIB (gastrointestinal bleeding)   . HLD (hyperlipidemia)   . HTN (hypertension)   . Peripheral artery disease ([place])   . Prostate cancer [facility])    Past Surgical History  Past Surgical History:  Procedure Laterality Date  . APPENDECTOMY    . CHOLECYSTECTOMY     Family History No family history on file. Social History  reports that he has never smoked. He has never used smokeless tobacco. He reports that he does not drink alcohol. No history on file for drug. Allergies No Known Allergies Home medications Prior to Admission medications   Medication Sig Start Date End Date Taking? Authorizing Provider  amLODipine (NORVASC) 10 MG tablet Take 10 mg by mouth daily. 09/19/19  Yes [provider]  aspirin EC 81 MG tablet Take 81 mg by mouth daily.   Yes [provider]  atorvastatin (LIPITOR) 40  MG tablet Take 40 mg by mouth every evening. 07/17/19  Yes [provider]  benazepril (LOTENSIN) 40 MG tablet Take 40 mg by mouth daily. 07/17/19  Yes [provider]  carvedilol (COREG) 25 MG tablet Take 25 mg by mouth 2 (two) times daily. 09/09/19  Yes [provider]  famotidine (PEPCID) 20 MG tablet Take 20 mg by mouth 2 (two) times daily. 08/03/19  Yes [provider]  gemfibrozil (LOPID) 600 MG tablet Take 600 mg by mouth 2 (two) times daily. 09/19/19  Yes [provider]  HUMALOG MIX 75/25 KWIKPEN (75-25) 100 UNIT/ML Kwikpen Inject 35 Units into the skin 2 (two) times daily.  10/04/19  Yes [provider]  naproxen sodium (ALEVE) 220 MG tablet Take 220-440 mg by mouth 2 (two) times daily as needed (pain.).   Yes [provider]   Recent Labs  Lab 10/24/2019 1935 11/01/2019 2133 11/06/19 0430 11/06/19 1957 11/07/19 0400  WBC 15.1*  --  15.0*  --  12.4*  HGB 8.7*   < > 8.4* 8.2* 7.3*  HCT 29.5*   < > 28.3* 24.0* 24.5*  MCV 95.8  --  95.6  --  96.8  PLT 178  --  194  --  177   < > = values in  this interval not displayed.   Recent Labs  Lab 10/31/2019 1935 11/06/2019 2133 11/06/19 0130 11/06/19 0430 11/06/19 1229 11/06/19 1955 11/06/19 1957 11/07/19 0400  NA 133* 132* 140 134* 136  --  138 141  K 6.8* 6.2* 4.8 6.3* 5.9*  --  5.0 4.6  CL 104  --  105 104 105  --   --  104  CO2 19*  --  24 20* 19*  --   --  26  GLUCOSE 181*  --  242* 201* 204*  --   --  225*  BUN 70*  --  92* 80* 83*  --   --  92*  CREATININE 2.92*  --  3.79* 3.30* 3.42*  --   --  3.82*  CALCIUM 7.7*  --  7.5* 7.8* 7.9*  --   --  7.5*  PHOS  --   --   --   --  7.4* 7.5*  --  7.6*   Iron/TIBC/Ferritin/ %Sat No results found for: IRON, TIBC, FERRITIN, IRONPCTSAT  Vitals:   11/07/19 0810 11/07/19 0900 11/07/19 1000 11/07/19 1100  BP: (!) 139/57 (!) 141/58 (!) 147/60 (!) 150/60  Pulse: 92 92 96 96  Resp: 20 20 20 20   Temp:      TempSrc:      SpO2:  100% 100% 100% 100%  Weight:      Height:        Exam Gen on vent, proned, intubated and sedated No rash, cyanosis or gangrene No jvd or bruits Chest clear bilat post / lat RRR no RG Abd not done due to proning GU deferred, foley draining clear yellowish urine MS no joint effusions or deformity Ext diffuse 2+ UE / LE edema, no wounds or ulcers Neuro as above    Home meds:  - amlodipine 10/ benazepril 25 bid/ carvedilol 25 bid  - aspirin 81/ atorvastatin 40 hs/ gemfibrozil 600 bid  - humalog 75/25 35u sq bid  - naproxen 220-440 bid prn  - famotidine 20 bid     UA 1/25 > turbid, large Hb, rbc >50, wbc 6-10, bact few, prot 100    UNa <10, UCr 147 on 11/08/2019     Renal US 1/26 > 12-14 cm kidneys w/o hydronephrosis     I/O 2.4L in and 2.4 L out, net = zero     Wts up 2kg from admit (122 > 124kg)     CXR 1/27 > IMPRESSION: Persistent bilateral mixed interstitial and airspace opacities throughout both lungs. Minimal improvement in the left lung base     BP's normal to high since arrival     No pressors here      UOP 400- 700 cc/day      Na 141 K 4.6  CO 26  UBN 92  Cr 3.82      OSH - creat was 1.9- 2.7       FiO2 70%  Assessment/ Plan: 1. AKI / renal failure - may have underlying CKD. Creat at OSH was 1.9- 2.7. Here creat up to 3.82 today.  Renal US shows normal kidneys. UA w/ rbc's, will repeat. Urine lytes show low FeNa. No clear etiology. No indication for RRT yet. Will follow.  2. BP/volume - not in shock yet. Sig vol overload/ edema on exam. CVP's pending. Some BP's are high here > will try IV lasix.   3. COVID +PNA - on vent, already had remdesivir/ decadron prior to tx here 4. H/o HTN 5. DM  on insulin   6. Suspected CKD 7. H/o CAD sp stent 8. H/o prostate cancer      Colton Splinter  MD 11/07/2019, 11:16 AM

## 2019-11-07 NOTE — Progress Notes (Signed)
Patient placed in supine position.  ETT secured.  No breakdown noted.  Patient tolerated well with no complications. 

## 2019-11-07 NOTE — Progress Notes (Signed)
NAME:  Colton Fox, MRN:  [id number], DOB:  [birth year], LOS: 2 ADMISSION DATE:  10/24/2019, CONSULTATION DATE:  11/07/2019 REFERRING MD:  OSH, CHIEF COMPLAINT:  Acute hypoxemic resp failure 2/2 COVID  Brief History   74 y/o male, COVID positive who presented to OSH 1/13 after 4 days of feeling poorly, dx/d with COVID and acute hypoxic resp failure, NSTEMI and AKI on CKD.  He decompensated slowly to warrant ETT placement 1/24. Transferred to [facility] 1/25.   Past Medical History  DM  CKD  HTN HLD CAD s/p PCI PAD GIB Appendectomy Prostate Cancer  Significant Hospital Events   1/13 Admit to OSH with 4 day hx of feeling poorly, COVID + 1/24 Intubated at OSH  1/25 Tx to [facility], prone positioning   Consults:    Procedures:  ETT 1/24 >>   Significant Diagnostic Tests:  Renal US 1/26 >> sub-optimal images but negative for hydronephrosis LE Venous Duplex 1/26 >>  ECHO 1/26 >> LV with mild concentric hypertrophy, global hypokinesis, LVEF ~45-50%  Micro Data:  BCx2 1/25 >>  MRSA PCXR 1/25 >> negative  UC 1/25 >> negative U. Strep Antigen 1/26 >> negative  Tracheal aspirate 1/25 >>  Antimicrobials:    Interim history/subjective:  Tmax 98.6 PEEP 12, FiO2 80%, Peak 34, Pplat 30 Glucose range 219-225 I/O UOP 735, +202 ml positive in the last 24 h Pt remains prone, on bicarb gtt  Objective   Blood pressure (!) 121/54, pulse 87, temperature 98.6 F (37 C), temperature source Oral, resp. rate 20, height 6\' 1"  (1.854 m), weight 124 kg, SpO2 100 %.    Vent Mode: PRVC FiO2 (%):  [80 %] 80 % Set Rate:  [20 bmp] 20 bmp Vt Set:  [560 mL] 560 mL PEEP:  [12 cmH20] 12 cmH20 Plateau Pressure:  [19 cmH20-29 cmH20] 19 cmH20   Intake/Output Summary (Last 24 hours) at 11/07/2019 0825 Last data filed at 11/07/2019 0600 Gross per 24 hour  Intake 1937.14 ml  Output 1735 ml  Net 202.14 ml   Filed Weights   10/19/2019 2000 11/06/19 0752 11/07/19 0500  Weight: 122.4 kg 123.8 kg 124 kg     Examination: General: elderly adult male lying in bed on vent in NAD, prone   HEENT: MM pink/moist, ETT Neuro: sedate, prone  CV: s1s2 RRR, no m/r/g PULM:  Synchronous on vent, distant breath sounds  GI: soft, bsx4 active  Extremities: warm/dry, no edema  Skin: no rashes or lesions   CXR 1/27 >> images personally reviewed, bilateral interstitial and alveolar opacities, ETT in good position  Resolved Hospital Problem list     Assessment & Plan:   Acute Hypoxemic Respiratory Failure secondary to COVID PNA  ARDS  Completed decadron 6mg  x10 days + additional solumedrol, remdesivir x5 days -low Vt ventilation 4-8cc/kg -goal plateau pressure <30, driving pressure <89 cm H2O -target PaO2 55-65, titrate PEEP/FiO2 per ARDS protocol  -P/F ratio <150, continue prone therapy for 16 hours per day -goal CVP <4, diuresis as necessary -VAP prevention measures  -follow intermittent CXR  -continue solumedrol  -await LE venous doppler   Sedation Needs secondary to Mechanical Ventilation  -PAD protocol with fentanyl, versed -RASS goal -2   AKI on CKD  Sr Cr 2.5 at OSH.  Unclear baseline but was 1.67 in 2013.  FENa suggestive of pre-renal failure. Note proteinuria on UA.  -continue bicarb gtt at 94ml/hr for now -Trend BMP / urinary output -Replace electrolytes as indicated -Avoid nephrotoxic agents, ensure adequate renal  perfusion -Nephrology consulted, appreciate input   Hyperkalemia S/P kayexalate -follow K closely   DM II  A1c 6.9 -SSI, moderate scale  -increase TF coverage to 5 units Q4  Hx HTN, HLD, CAD s/p PCI, PAD -hold home agents -ICU monitoring  -f/u EKG   Best practice:  Diet: TF Pain/Anxiety/Delirium protocol (if indicated): protocol VAP protocol (if indicated): yes DVT prophylaxis: heparin sq GI prophylaxis: famotidine Glucose control: SSI Mobility: bedrest Code Status: Full Code  Family Communication: Wife Arville Go) called for update 1/27 am at [phone]  (number in chart gives a busy tone).  Disposition: ICU  Labs   CBC: Recent Labs  Lab 11/08/2019 1935 10/18/2019 2133 11/06/19 0430 11/06/19 1957 11/07/19 0400  WBC 15.1*  --  15.0*  --  12.4*  HGB 8.7* 8.8* 8.4* 8.2* 7.3*  HCT 29.5* 26.0* 28.3* 24.0* 24.5*  MCV 95.8  --  95.6  --  96.8  PLT 178  --  194  --  832    Basic Metabolic Panel: Recent Labs  Lab 10/19/2019 1935 10/30/2019 2133 11/06/19 0130 11/06/19 0430 11/06/19 1229 11/06/19 1955 11/06/19 1957 11/07/19 0400  NA 133*   < > 140 134* 136  --  138 141  K 6.8*   < > 4.8 6.3* 5.9*  --  5.0 4.6  CL 104  --  105 104 105  --   --  104  CO2 19*  --  24 20* 19*  --   --  26  GLUCOSE 181*  --  242* 201* 204*  --   --  225*  BUN 70*  --  92* 80* 83*  --   --  92*  CREATININE 2.92*  --  3.79* 3.30* 3.42*  --   --  3.82*  CALCIUM 7.7*  --  7.5* 7.8* 7.9*  --   --  7.5*  MG 3.6*  --   --  3.7* 3.9* 3.8*  --  3.7*  PHOS  --   --   --   --  7.4* 7.5*  --  7.6*   < > = values in this interval not displayed.   GFR: Estimated Creatinine Clearance: 23.8 mL/min (A) (by C-G formula based on SCr of 3.82 mg/dL (H)). Recent Labs  Lab 10/21/2019 1935 10/29/2019 2320 11/06/19 0430 11/07/19 0400  PROCALCITON 0.27  --   --   --   WBC 15.1*  --  15.0* 12.4*  LATICACIDVEN 1.6 1.6  --   --     Liver Function Tests: Recent Labs  Lab 11/11/2019 1935  AST 28  ALT 14  ALKPHOS 106  BILITOT 0.9  PROT 6.0*  ALBUMIN 2.1*   No results for input(s): LIPASE, AMYLASE in the last 168 hours. No results for input(s): AMMONIA in the last 168 hours.  ABG    Component Value Date/Time   PHART 7.326 (L) 11/06/2019 1957   PCO2ART 44.6 11/06/2019 1957   PO2ART 131.0 (H) 11/06/2019 1957   HCO3 23.3 11/06/2019 1957   TCO2 25 11/06/2019 1957   ACIDBASEDEF 3.0 (H) 11/06/2019 1957   O2SAT 99.0 11/06/2019 1957     Coagulation Profile: Recent Labs  Lab 10/23/2019 1935  INR 1.8*    Cardiac Enzymes: No results for input(s): CKTOTAL, CKMB,  CKMBINDEX, TROPONINI in the last 168 hours.  HbA1C: Hgb A1c MFr Bld  Date/Time Value Ref Range Status  10/30/2019 07:35 PM 6.9 (H) 4.8 - 5.6 % Final    Comment:    (NOTE)  Pre diabetes:          5.7%-6.4% Diabetes:              >6.4% Glycemic control for   <7.0% adults with diabetes     CBG: Recent Labs  Lab 11/06/19 1204 11/06/19 1629 11/06/19 1929 11/06/19 2345 11/07/19 0347  GLUCAP 208* 273* 221* 219* 231*     Critical care time: 32 minutes    Noe Gens, MSN, NP-C [facility] 11/07/2019, 8:25 AM   Please see [email or web address] for pager details.

## 2019-11-07 NOTE — Progress Notes (Signed)
Patient grandson Colton Fox updated this AM regarding current status. Patient was supined with assist of staff x5 and RT at [facility]. No issues during supination.

## 2019-11-07 NOTE — Progress Notes (Signed)
Patient VSS overnight, patient making better urine output. Patient placed in the prone position at 0215 per ABG results. Patient sedated on fentanyl/versed gtt, synchronous with ventilator. K+ trending down. FMS in place. Patient on 80%/12PEEP.

## 2019-11-07 NOTE — Progress Notes (Signed)
Patient's head repositioned and arms rotated.  Patient tolerated well.  No skin breakdown noted at this time.    

## 2019-11-07 NOTE — Progress Notes (Signed)
Pt's head turned at this time w/o complication

## 2019-11-08 ENCOUNTER — Inpatient Hospital Stay (HOSPITAL_COMMUNITY): Payer: Medicare Other

## 2019-11-08 DIAGNOSIS — I82409 Acute embolism and thrombosis of unspecified deep veins of unspecified lower extremity: Secondary | ICD-10-CM

## 2019-11-08 LAB — COMPREHENSIVE METABOLIC PANEL
ALT: 15 U/L (ref 0–44)
AST: 21 U/L (ref 15–41)
Albumin: 2.2 g/dL — ABNORMAL LOW (ref 3.5–5.0)
Alkaline Phosphatase: 82 U/L (ref 38–126)
Anion gap: 12 (ref 5–15)
BUN: 114 mg/dL — ABNORMAL HIGH (ref 8–23)
CO2: 31 mmol/L (ref 22–32)
Calcium: 7.4 mg/dL — ABNORMAL LOW (ref 8.9–10.3)
Chloride: 102 mmol/L (ref 98–111)
Creatinine, Ser: 3.33 mg/dL — ABNORMAL HIGH (ref 0.61–1.24)
GFR calc Af Amer: 20 mL/min — ABNORMAL LOW (ref >60)
GFR calc non Af Amer: 17 mL/min — ABNORMAL LOW (ref >60)
Glucose, Bld: 346 mg/dL — ABNORMAL HIGH (ref 70–99)
Potassium: 3.4 mmol/L — ABNORMAL LOW (ref 3.5–5.1)
Sodium: 145 mmol/L (ref 135–145)
Total Bilirubin: 0.5 mg/dL (ref 0.3–1.2)
Total Protein: 5.4 g/dL — ABNORMAL LOW (ref 6.5–8.1)

## 2019-11-08 LAB — POCT I-STAT 7, (LYTES, BLD GAS, ICA,H+H)
Acid-Base Excess: 7 mmol/L — ABNORMAL HIGH (ref 0.0–2.0)
Bicarbonate: 33.8 mmol/L — ABNORMAL HIGH (ref 20.0–28.0)
Calcium, Ion: 1.11 mmol/L — ABNORMAL LOW (ref 1.15–1.40)
HCT: 23 % — ABNORMAL LOW (ref 39.0–52.0)
Hemoglobin: 7.8 g/dL — ABNORMAL LOW (ref 13.0–17.0)
O2 Saturation: 88 %
Patient temperature: 98.1
Potassium: 3.3 mmol/L — ABNORMAL LOW (ref 3.5–5.1)
Sodium: 145 mmol/L (ref 135–145)
TCO2: 36 mmol/L — ABNORMAL HIGH (ref 22–32)
pCO2 arterial: 59.7 mmHg — ABNORMAL HIGH (ref 32.0–48.0)
pH, Arterial: 7.359 (ref 7.350–7.450)
pO2, Arterial: 58 mmHg — ABNORMAL LOW (ref 83.0–108.0)

## 2019-11-08 LAB — GLUCOSE, CAPILLARY
Glucose-Capillary: 319 mg/dL — ABNORMAL HIGH (ref 70–99)
Glucose-Capillary: 322 mg/dL — ABNORMAL HIGH (ref 70–99)
Glucose-Capillary: 340 mg/dL — ABNORMAL HIGH (ref 70–99)
Glucose-Capillary: 341 mg/dL — ABNORMAL HIGH (ref 70–99)
Glucose-Capillary: 359 mg/dL — ABNORMAL HIGH (ref 70–99)
Glucose-Capillary: 361 mg/dL — ABNORMAL HIGH (ref 70–99)
Glucose-Capillary: 371 mg/dL — ABNORMAL HIGH (ref 70–99)

## 2019-11-08 LAB — BASIC METABOLIC PANEL
Anion gap: 13 (ref 5–15)
BUN: 121 mg/dL — ABNORMAL HIGH (ref 8–23)
CO2: 31 mmol/L (ref 22–32)
Calcium: 7.6 mg/dL — ABNORMAL LOW (ref 8.9–10.3)
Chloride: 103 mmol/L (ref 98–111)
Creatinine, Ser: 3.07 mg/dL — ABNORMAL HIGH (ref 0.61–1.24)
GFR calc Af Amer: 22 mL/min — ABNORMAL LOW (ref >60)
GFR calc non Af Amer: 19 mL/min — ABNORMAL LOW (ref >60)
Glucose, Bld: 365 mg/dL — ABNORMAL HIGH (ref 70–99)
Potassium: 3.5 mmol/L (ref 3.5–5.1)
Sodium: 147 mmol/L — ABNORMAL HIGH (ref 135–145)

## 2019-11-08 LAB — CBC
HCT: 23.2 % — ABNORMAL LOW (ref 39.0–52.0)
Hemoglobin: 7.1 g/dL — ABNORMAL LOW (ref 13.0–17.0)
MCH: 29.3 pg (ref 26.0–34.0)
MCHC: 30.6 g/dL (ref 30.0–36.0)
MCV: 95.9 fL (ref 80.0–100.0)
Platelets: 156 10*3/uL (ref 150–400)
RBC: 2.42 MIL/uL — ABNORMAL LOW (ref 4.22–5.81)
RDW: 16.8 % — ABNORMAL HIGH (ref 11.5–15.5)
WBC: 13 10*3/uL — ABNORMAL HIGH (ref 4.0–10.5)
nRBC: 0.9 % — ABNORMAL HIGH (ref 0.0–0.2)

## 2019-11-08 LAB — D-DIMER, QUANTITATIVE: D-Dimer, Quant: 2.82 ug/mL-FEU — ABNORMAL HIGH (ref 0.00–0.50)

## 2019-11-08 MED ORDER — POTASSIUM CHLORIDE CRYS ER 20 MEQ PO TBCR
30.0000 meq | EXTENDED_RELEASE_TABLET | Freq: Three times a day (TID) | ORAL | Status: AC
  Administered 2019-11-08 (×2): 30 meq via ORAL
  Filled 2019-11-08 (×2): qty 2

## 2019-11-08 MED ORDER — NEPRO/CARBSTEADY PO LIQD
1000.0000 mL | ORAL | Status: DC
  Administered 2019-11-08 – 2019-11-10 (×3): 1000 mL
  Filled 2019-11-08 (×5): qty 1000

## 2019-11-08 MED ORDER — FUROSEMIDE 10 MG/ML IJ SOLN
100.0000 mg | Freq: Three times a day (TID) | INTRAVENOUS | Status: DC
  Administered 2019-11-08 – 2019-11-09 (×3): 100 mg via INTRAVENOUS
  Filled 2019-11-08 (×4): qty 10

## 2019-11-08 MED ORDER — PRO-STAT SUGAR FREE PO LIQD
60.0000 mL | Freq: Three times a day (TID) | ORAL | Status: DC
  Administered 2019-11-08 – 2019-11-10 (×7): 60 mL
  Filled 2019-11-08 (×7): qty 60

## 2019-11-08 MED ORDER — INSULIN ASPART 100 UNIT/ML ~~LOC~~ SOLN
8.0000 [IU] | SUBCUTANEOUS | Status: DC
  Administered 2019-11-08 – 2019-11-12 (×19): 8 [IU] via SUBCUTANEOUS

## 2019-11-08 MED ORDER — INSULIN ASPART 100 UNIT/ML ~~LOC~~ SOLN
0.0000 [IU] | SUBCUTANEOUS | Status: DC
  Administered 2019-11-08 (×3): 15 [IU] via SUBCUTANEOUS
  Administered 2019-11-08 – 2019-11-09 (×4): 20 [IU] via SUBCUTANEOUS
  Administered 2019-11-09 (×2): 15 [IU] via SUBCUTANEOUS
  Administered 2019-11-09: 13:00:00 20 [IU] via SUBCUTANEOUS
  Administered 2019-11-10: 15 [IU] via SUBCUTANEOUS
  Administered 2019-11-10: 7 [IU] via SUBCUTANEOUS
  Administered 2019-11-10 (×3): 11 [IU] via SUBCUTANEOUS
  Administered 2019-11-10: 17:00:00 15 [IU] via SUBCUTANEOUS
  Administered 2019-11-11: 4 [IU] via SUBCUTANEOUS
  Administered 2019-11-11: 08:00:00 3 [IU] via SUBCUTANEOUS
  Administered 2019-11-11: 01:00:00 11 [IU] via SUBCUTANEOUS
  Administered 2019-11-12 (×2): 4 [IU] via SUBCUTANEOUS
  Administered 2019-11-12: 3 [IU] via SUBCUTANEOUS
  Administered 2019-11-12: 12:00:00 4 [IU] via SUBCUTANEOUS
  Administered 2019-11-13 (×3): 3 [IU] via SUBCUTANEOUS

## 2019-11-08 MED ORDER — INSULIN DETEMIR 100 UNIT/ML ~~LOC~~ SOLN
5.0000 [IU] | Freq: Two times a day (BID) | SUBCUTANEOUS | Status: DC
  Administered 2019-11-08: 5 [IU] via SUBCUTANEOUS
  Filled 2019-11-08 (×2): qty 0.05

## 2019-11-08 MED ORDER — INSULIN DETEMIR 100 UNIT/ML ~~LOC~~ SOLN
10.0000 [IU] | Freq: Two times a day (BID) | SUBCUTANEOUS | Status: DC
  Administered 2019-11-08 – 2019-11-09 (×2): 10 [IU] via SUBCUTANEOUS
  Filled 2019-11-08 (×2): qty 0.1

## 2019-11-08 NOTE — Progress Notes (Signed)
[facility] Progress Note  Subjective: UOP up to 1.8 L w/ IV lasix '80mg'$  last night x 1.  Creat down slightly today.   Vitals:   11/08/19 0240 11/08/19 0300 11/08/19 0400 11/08/19 0500  BP: (!) 147/68 (!) 147/67 (!) 152/68 (!) 147/72  Pulse: 82 84 83 81  Resp: '20 20 20 20  '$ Temp:   98.5 F (36.9 C)   TempSrc:   Axillary   SpO2: 98% 97% 98% 98%  Weight:      Height:        Exam:  Patient not examined today directly given COVID-19 + status, utilizing data taken from chart +/- discussions w/ providers and staff.      Home meds:  - amlodipine 10/ benazepril 25 bid/ carvedilol 25 bid  - aspirin 81/ atorvastatin 40 hs/ gemfibrozil 600 bid  - humalog 75/25 35u sq bid  - naproxen 220-440 bid prn  - famotidine 20 bid     UA 1/25 > turbid, large Hb, rbc >50, wbc 6-10, bact few, prot 100    UNa <10, UCr 147 on 11/10/2019     Renal US 1/26 > 12-14 cm kidneys w/o hydronephrosis     CXR 1/27 > IMPRESSION: Persistent bilateral mixed interstitial and airspace opacities throughout both lungs. Minimal improvement in the left lung base     BP's normal to high since arrival      OSH - creat was 1.9- 2.7       FiO2 70%  Assessment/ Plan: 1. AKI / renal failure - may have underlying CKD. Creat at OSH was 1.9- 2.7.  Renal US shows normal kidneys. UA w/ rbc's, will repeat. Urine lytes showed low FeNa. This may be cardiorenal / vol overload > started IV lasix yest, creat down to 3.3 this am, good UOP. Will cont lasix and gradually ^ dose , '100mg'$  q 8 hr today.  2. BP/volume - no pressors, BP's up. Sig vol overload/ edema on exam. as above 3. COVID +PNA - on vent, sp remdesivir/ decadron at outside hospital prior to tx 4. H/o HTN - BP's on higher side, home meds are on hold; avoid acei/ arb 5. DM on insulin   6. Suspected CKD 7. H/o CAD sp stent 8. H/o prostate cancer      [name] 11/08/2019, 5:43 AM  Inpatient medications: . chlorhexidine gluconate (MEDLINE KIT)  15  mL Mouth Rinse BID  . Chlorhexidine Gluconate Cloth  6 each Topical Daily  . famotidine  20 mg Per Tube Daily  . feeding supplement (PRO-STAT SUGAR FREE 64)  60 mL Per Tube QID  . furosemide  80 mg Intravenous BID  . heparin injection (subcutaneous)  10,000 Units Subcutaneous Q8H  . insulin aspart  0-15 Units Subcutaneous Q4H  . insulin aspart  5 Units Subcutaneous Q4H  . mouth rinse  15 mL Mouth Rinse 10 times per day  . methylPREDNISolone (SOLU-MEDROL) injection  40 mg Intravenous Q8H   . feeding supplement (NEPRO CARB STEADY) 40 mL/hr at 11/08/19 0000  . fentaNYL infusion INTRAVENOUS 200 mcg/hr (11/08/19 0523)  . midazolam 7 mg/hr (11/08/19 0400)  .  sodium bicarbonate (isotonic) infusion in sterile water 50 mL/hr at 11/08/19 0400   acetaminophen (TYLENOL) oral liquid 160 mg/5 mL, docusate, fentaNYL, midazolam, midazolam Recent Labs  Lab 11/06/19 1229 11/06/19 1955 11/07/19 0400 11/07/19 1639 11/07/19 1700  NA 136   < > 141 141  --   K 5.9*   < > 4.6 3.9  --  CL 105  --  104  --   --   CO2 19*  --  26  --   --   GLUCOSE 204*  --  225*  --   --   BUN 83*  --  92*  --   --   CREATININE 3.42*  --  3.82*  --   --   CALCIUM 7.9*  --  7.5*  --   --   PHOS 7.4*   < > 7.6*  --  8.7*   < > = values in this interval not displayed.

## 2019-11-08 NOTE — Progress Notes (Signed)
Lower extremity venous has been completed.   Preliminary results in CV Proc.   Abram Sander 11/08/2019 1:55 PM

## 2019-11-08 NOTE — Progress Notes (Signed)
NAME:  Colton Fox, MRN:  [id number], DOB:  [birth year], LOS: 3 ADMISSION DATE:  10/25/2019, CONSULTATION DATE:  10/27/2019 REFERRING MD:  OSH, CHIEF COMPLAINT:  Acute hypoxemic resp failure 2/2 COVID  Brief History   74 y/o male, COVID positive who presented to OSH 1/13 after 4 days of feeling poorly, dx/d with COVID and acute hypoxic resp failure, NSTEMI and AKI on CKD.  He decompensated slowly to warrant ETT placement 1/24. Transferred to [facility] 1/25.   Past Medical History  DM  CKD  HTN HLD CAD s/p PCI PAD GIB Appendectomy Prostate Cancer  Significant Hospital Events   1/13 Admit to OSH with 4 day hx of feeling poorly, COVID + 1/24 Intubated at OSH  1/25 Tx to [facility], prone positioning  1/27 Nephrology consulted 1/28 Bicarb gtt stopped, modestly improved renal function. Hyperglycemia  Consults:    Procedures:  ETT 1/24 >>   Significant Diagnostic Tests:  Renal US 1/26 >> sub-optimal images but negative for hydronephrosis LE Venous Duplex 1/26 >>  ECHO 1/26 >> LV with mild concentric hypertrophy, global hypokinesis, LVEF ~45-50%  Micro Data:  BCx2 1/25 >>  MRSA PCXR 1/25 >> negative  UC 1/25 >> negative U. Strep Antigen 1/26 >> negative  Tracheal aspirate 1/25 >>  Antimicrobials:    Interim history/subjective:  Afebrile  PEEP 12 / 50% Glucose range 340-360 I/O UOP 1.4L, +521 for 24h RN reports no acute events. P/F ratio improved, no prone   Objective   Blood pressure (!) 151/69, pulse 80, temperature 97.8 F (36.6 C), temperature source Oral, resp. rate 19, height 6\' 1"  (1.854 m), weight 124 kg, SpO2 (!) 89 %.    Vent Mode: PRVC FiO2 (%):  [40 %-80 %] 40 % Set Rate:  [20 bmp] 20 bmp Vt Set:  [560 mL] 560 mL PEEP:  [12 cmH20] 12 cmH20 Plateau Pressure:  [27 cmH20-30 cmH20] 28 cmH20   Intake/Output Summary (Last 24 hours) at 11/08/2019 3818 Last data filed at 11/08/2019 0800 Gross per 24 hour  Intake 2289.12 ml  Output 1820 ml  Net 469.12 ml   Filed  Weights   10/24/2019 2000 11/06/19 0752 11/07/19 0500  Weight: 122.4 kg 123.8 kg 124 kg    Examination: General: elderly male lying in bed in NAD on vent, critically ill appearing  HEENT: MM pink/moist, ETT Neuro: sedate  CV: s1s2 RRR, no m/r/g PULM: non-labored on vent, lungs bilaterally diminished, no wheezing GI: soft, bsx4 active  Extremities: warm/dry, trace LE edema  Skin: no rashes or lesions  CXR 1/28 >> images personally reviewed, stable airspace disease bilaterally, ETT in good position  Resolved Hospital Problem list     Assessment & Plan:   Acute Hypoxemic Respiratory Failure secondary to COVID PNA  ARDS  Completed decadron 6mg  x10 days + additional solumedrol, remdesivir x5 days -low Vt ventilation 4-8cc/kg -goal plateau pressure <30, driving pressure <29 cm H2O -target PaO2 55-65, titrate PEEP/FiO2 per ARDS protocol  -if P/F ratio <150, consider prone therapy for 16 hours per day -goal CVP <4, diuresis as necessary -VAP prevention measures  -follow intermittent CXR  -continue solumedrol -await LE Dopper > pending due to pt being prone  Sedation Needs secondary to Mechanical Ventilation  -PAD protocol with versed, fentanyl  -RASS Goal -2   HFrEF Poor images on ECHO but global hypokinesis  -diuresis per Nephrology  AKI on CKD  Sr Cr 2.5 at OSH.  Unclear baseline but was 1.67 in 2013.  FENa suggestive of pre-renal failure.  Note proteinuria on UA.  -Trend BMP / urinary output -Replace electrolytes as indicated -Avoid nephrotoxic agents, ensure adequate renal perfusion -Appreciate Nephrology  -diuresis per renal  -stop bicarb gtt   Hyperkalemia / Hypokalemia S/P kayexalate -follow K trend  -hold replacement of K 1/28 as was on Bicarb gtt  -follow up BMP at 1600 post lasix as may need KCL  Anemia  -Trend CBC -transfuse for Hgb <7%  DM II  A1c 6.9 -SSI, increase to resistant scale -add levemir 5 units BID  -continue TF coverage, 5 units  Hx  HTN, HLD, CAD s/p PCI, PAD -hold home agents -tele monitoring   Best practice:  Diet: TF Pain/Anxiety/Delirium protocol (if indicated): protocol VAP protocol (if indicated): yes DVT prophylaxis: heparin sq GI prophylaxis: famotidine Glucose control: SSI Mobility: bedrest Code Status: Full Code  Family Communication: Wife Arville Go) called for update 1/28 at [phone] (number in chart gives a busy tone).  Disposition: ICU  Labs   CBC: Recent Labs  Lab 10/18/2019 1935 10/23/2019 2133 11/06/19 0430 11/06/19 1957 11/07/19 0400 11/07/19 1639 11/08/19 0500  WBC 15.1*  --  15.0*  --  12.4*  --  13.0*  HGB 8.7*   < > 8.4* 8.2* 7.3* 7.5* 7.1*  HCT 29.5*   < > 28.3* 24.0* 24.5* 22.0* 23.2*  MCV 95.8  --  95.6  --  96.8  --  95.9  PLT 178  --  194  --  177  --  156   < > = values in this interval not displayed.    Basic Metabolic Panel: Recent Labs  Lab 10/14/2019 1935 11/06/19 0130 11/06/19 0430 11/06/19 0430 11/06/19 1229 11/06/19 1955 11/06/19 1957 11/07/19 0400 11/07/19 1639 11/07/19 1700 11/08/19 0500  NA   < > 140 134*   < > 136  --  138 141 141  --  145  K   < > 4.8 6.3*   < > 5.9*  --  5.0 4.6 3.9  --  3.4*  CL  --  105 104  --  105  --   --  104  --   --  102  CO2  --  24 20*  --  19*  --   --  26  --   --  31  GLUCOSE  --  242* 201*  --  204*  --   --  225*  --   --  346*  BUN  --  92* 80*  --  83*  --   --  92*  --   --  114*  CREATININE  --  3.79* 3.30*  --  3.42*  --   --  3.82*  --   --  3.33*  CALCIUM  --  7.5* 7.8*  --  7.9*  --   --  7.5*  --   --  7.4*  MG   < >  --  3.7*  --  3.9* 3.8*  --  3.7*  --  2.6*  --   PHOS  --   --   --   --  7.4* 7.5*  --  7.6*  --  8.7*  --    < > = values in this interval not displayed.   GFR: Estimated Creatinine Clearance: 27.2 mL/min (A) (by C-G formula based on SCr of 3.33 mg/dL (H)). Recent Labs  Lab 10/31/2019 1935 11/08/2019 2320 11/06/19 0430 11/07/19 0400 11/08/19 0500  PROCALCITON 0.27  --   --   --   --  WBC 15.1*  --  15.0* 12.4* 13.0*  LATICACIDVEN 1.6 1.6  --   --   --     Liver Function Tests: Recent Labs  Lab 11/03/2019 1935 11/08/19 0500  AST 28 21  ALT 14 15  ALKPHOS 106 82  BILITOT 0.9 0.5  PROT 6.0* 5.4*  ALBUMIN 2.1* 2.2*   No results for input(s): LIPASE, AMYLASE in the last 168 hours. No results for input(s): AMMONIA in the last 168 hours.  ABG    Component Value Date/Time   PHART 7.362 11/07/2019 1639   PCO2ART 53.7 (H) 11/07/2019 1639   PO2ART 135.0 (H) 11/07/2019 1639   HCO3 30.5 (H) 11/07/2019 1639   TCO2 32 11/07/2019 1639   ACIDBASEDEF 3.0 (H) 11/06/2019 1957   O2SAT 99.0 11/07/2019 1639     Coagulation Profile: Recent Labs  Lab 10/26/2019 1935  INR 1.8*    Cardiac Enzymes: No results for input(s): CKTOTAL, CKMB, CKMBINDEX, TROPONINI in the last 168 hours.  HbA1C: Hgb A1c MFr Bld  Date/Time Value Ref Range Status  11/10/2019 07:35 PM 6.9 (H) 4.8 - 5.6 % Final    Comment:    (NOTE) Pre diabetes:          5.7%-6.4% Diabetes:              >6.4% Glycemic control for   <7.0% adults with diabetes     CBG: Recent Labs  Lab 11/07/19 1601 11/07/19 2052 11/08/19 0015 11/08/19 0411 11/08/19 0741  GLUCAP 311* 315* 361* 340* 341*     Critical care time: 33 minutes    Noe Gens, MSN, NP-C [facility] 11/08/2019, 8:08 AM   Please see [email or web address] for pager details.

## 2019-11-08 NOTE — Progress Notes (Signed)
NAME:  Colton Fox, MRN:  [id number], DOB:  [birth year], LOS: 3 ADMISSION DATE:  10/15/2019, CONSULTATION DATE:  11/11/2019 REFERRING MD:  OSH, CHIEF COMPLAINT:  Acute hypoxemic resp failure 2/2 COVID  Brief History   74 y/o male, COVID positive who presented to OSH 1/13 after 4 days of feeling poorly, dx/d with COVID and acute hypoxic resp failure, NSTEMI and AKI on CKD.  He decompensated slowly to warrant ETT placement 1/24. Transferred to [facility] 1/25.   Past Medical History  DM  CKD  HTN HLD CAD s/p PCI PAD GIB Appendectomy Prostate Cancer  Significant Hospital Events   1/13 Admit to OSH with 4 day hx of feeling poorly, COVID + 1/24 Intubated at OSH  1/25 Tx to [facility], prone positioning  1/27 Nephrology consulted 1/28 Bicarb gtt stopped, modestly improved renal function. Hyperglycemia  Consults:    Procedures:  ETT 1/24 >>   Significant Diagnostic Tests:  Renal US 1/26 >> sub-optimal images but negative for hydronephrosis LE Venous Duplex 1/26 >> negative for DVT ECHO 1/26 >> LV with mild concentric hypertrophy, global hypokinesis, LVEF ~45-50%  Micro Data:  BCx2 1/25 >>  MRSA PCXR 1/25 >> negative  UC 1/25 >> negative U. Strep Antigen 1/26 >> negative  Tracheal aspirate 1/25 >>  Antimicrobials:  Remdesivir completed  Interim history/subjective:  Tmax 99.1 PEEP 12, FiO2 down to 0.40 Glucose range 300s I/O -1.5 L total Fentanyl 150, Versed 3 No pressors. Serum creatinine elevated but stable  Objective   Blood pressure (!) 145/71, pulse 84, temperature 98.7 F (37.1 C), temperature source Oral, resp. rate 18, height 6\' 1"  (1.854 m), weight 124 kg, SpO2 96 %.    Vent Mode: PRVC FiO2 (%):  [40 %-70 %] 45 % Set Rate:  [20 bmp] 20 bmp Vt Set:  [560 mL] 560 mL PEEP:  [12 cmH20] 12 cmH20 Plateau Pressure:  [27 cmH20-28 cmH20] 28 cmH20   Intake/Output Summary (Last 24 hours) at 11/08/2019 1254 Last data filed at 11/08/2019 1100 Gross per 24 hour  Intake 2457.41 ml   Output 2270 ml  Net 187.41 ml   Filed Weights   10/14/2019 2000 11/06/19 0752 11/07/19 0500  Weight: 122.4 kg 123.8 kg 124 kg    Examination: General:  Ill appearing sedated intubated HEENT:ETT in place Neuro: deeply sedated JK:DTOIZTI regular PULM:  Coarse B GI: hypoactive BS Extremities: warm/dry, 1+ edema Skin: no rashes or lesions  Resolved Hospital Problem list     Assessment & Plan:   Acute Hypoxemic Respiratory Failure secondary to COVID PNA  ARDS  Mechanical ventilation via ARDS protocol, target PRVC 6 cc/kg Wean PEEP and FiO2 as able.  Should be able to start decreasing PEEP given improvement in FiO2 1/29 Goal plateau pressure less than 30, driving pressure less than 15 No indication for paralytics at this time May be able to stop prone positioning given improving gas exchange.  Continue to consider as needed Sedation per PAD protocol, goal RASS -2 as below Diuresis as blood pressure and renal function can tolerate, goal CVP 5-8.  Dosing Lasix daily as per nephrology recommendations.  VAP prevention order set Completed dexamethasone x10 days, additional Solu-Medrol added with decompensation.  Planned timing unclear, consider initiate taper 1/30 if PEEP needs improved. No evidence DVT by LE Doppler 1/28, continue aggressive prophylactic anticoagulation  Sedation Needs secondary to Mechanical Ventilation  PAD protocol, Versed and fentanyl RASS goal -2  HFrEF Poor images on ECHO but global hypokinesis  Daily diuresis as blood pressure  and renal function can tolerate.  Appreciate nephrology assistance.  Lasix 80 mg every 12 hours ordered on 1/29  AKI on CKD  Sr Cr 2.5 at OSH.  Unclear baseline but was 1.67 in 2013.  FENa suggestive of pre-renal failure. Note proteinuria on UA.  Appreciate nephrology management, dosing of diuretics Follow urine output and BMP Replace electrolytes as indicated Avoid nephrotoxins, ensure adequate renal perfusion  Hyperkalemia /  Hypokalemia Follow BMP and replace potassium as indicated, dose given 1/29  Anemia  Follow CBC Conservative transfusion protocol for Hgb <7%  DM II  A1c 6.9 Sliding scale insulin as per protocol Tube feeding coverage 8 units every 4 hours Levemir currently 10 units twice daily, plan increase on 1/29 to 15 u  Hx HTN, HLD, CAD s/p PCI, PAD Home antihypertensive meds, cardiac meds on hold Telemetry  Best practice:  Diet: TF Pain/Anxiety/Delirium protocol (if indicated): protocol VAP protocol (if indicated): yes DVT prophylaxis: heparin sq GI prophylaxis: famotidine Glucose control: SSI Mobility: bedrest Code Status: Full Code  Family Communication: Plan to call Wife Arville Go) for update today  > at [phone] (number in chart gives a busy tone).  Disposition: ICU  Labs   CBC: Recent Labs  Lab 11/06/2019 1935 10/22/2019 2133 11/06/19 0430 11/06/19 1957 11/07/19 0400 11/07/19 1639 11/08/19 0500  WBC 15.1*  --  15.0*  --  12.4*  --  13.0*  HGB 8.7*   < > 8.4* 8.2* 7.3* 7.5* 7.1*  HCT 29.5*   < > 28.3* 24.0* 24.5* 22.0* 23.2*  MCV 95.8  --  95.6  --  96.8  --  95.9  PLT 178  --  194  --  177  --  156   < > = values in this interval not displayed.    Basic Metabolic Panel: Recent Labs  Lab 10/27/2019 1935 11/06/19 0130 11/06/19 0430 11/06/19 0430 11/06/19 1229 11/06/19 1955 11/06/19 1957 11/07/19 0400 11/07/19 1639 11/07/19 1700 11/08/19 0500  NA   < > 140 134*   < > 136  --  138 141 141  --  145  K   < > 4.8 6.3*   < > 5.9*  --  5.0 4.6 3.9  --  3.4*  CL  --  105 104  --  105  --   --  104  --   --  102  CO2  --  24 20*  --  19*  --   --  26  --   --  31  GLUCOSE  --  242* 201*  --  204*  --   --  225*  --   --  346*  BUN  --  92* 80*  --  83*  --   --  92*  --   --  114*  CREATININE  --  3.79* 3.30*  --  3.42*  --   --  3.82*  --   --  3.33*  CALCIUM  --  7.5* 7.8*  --  7.9*  --   --  7.5*  --   --  7.4*  MG   < >  --  3.7*  --  3.9* 3.8*  --  3.7*  --  2.6*   --   PHOS  --   --   --   --  7.4* 7.5*  --  7.6*  --  8.7*  --    < > = values in this interval not displayed.   GFR:  Estimated Creatinine Clearance: 27.2 mL/min (A) (by C-G formula based on SCr of 3.33 mg/dL (H)). Recent Labs  Lab 11/11/2019 1935 10/19/2019 2320 11/06/19 0430 11/07/19 0400 11/08/19 0500  PROCALCITON 0.27  --   --   --   --   WBC 15.1*  --  15.0* 12.4* 13.0*  LATICACIDVEN 1.6 1.6  --   --   --     Liver Function Tests: Recent Labs  Lab 10/16/2019 1935 11/08/19 0500  AST 28 21  ALT 14 15  ALKPHOS 106 82  BILITOT 0.9 0.5  PROT 6.0* 5.4*  ALBUMIN 2.1* 2.2*   No results for input(s): LIPASE, AMYLASE in the last 168 hours. No results for input(s): AMMONIA in the last 168 hours.  ABG    Component Value Date/Time   PHART 7.362 11/07/2019 1639   PCO2ART 53.7 (H) 11/07/2019 1639   PO2ART 135.0 (H) 11/07/2019 1639   HCO3 30.5 (H) 11/07/2019 1639   TCO2 32 11/07/2019 1639   ACIDBASEDEF 3.0 (H) 11/06/2019 1957   O2SAT 99.0 11/07/2019 1639     Coagulation Profile: Recent Labs  Lab 10/14/2019 1935  INR 1.8*    Cardiac Enzymes: No results for input(s): CKTOTAL, CKMB, CKMBINDEX, TROPONINI in the last 168 hours.  HbA1C: Hgb A1c MFr Bld  Date/Time Value Ref Range Status  10/26/2019 07:35 PM 6.9 (H) 4.8 - 5.6 % Final    Comment:    (NOTE) Pre diabetes:          5.7%-6.4% Diabetes:              >6.4% Glycemic control for   <7.0% adults with diabetes     CBG: Recent Labs  Lab 11/07/19 2052 11/08/19 0015 11/08/19 0411 11/08/19 0741 11/08/19 1207  GLUCAP 315* 361* 340* 341* 371*     Critical care time: 33 minutes     Baltazar Apo, MD, PhD 11/09/2019, 9:55 AM [facility] [phone] or if no answer [phone]

## 2019-11-08 NOTE — Progress Notes (Signed)
Right upper lip deep tissue injury noted after repositioning ETT with RT. Patient remains calm and comfortable without distress. 20 Second episode observed on monitor today. This terminated spontaneously. Critical care aware of 1200 blood glucose. Spoke with spouse Divante Kotch this AM. She was updated on current ventilator FiO and SpO levels at the time of the call.

## 2019-11-08 NOTE — Progress Notes (Signed)
Nutrition Follow-up  DOCUMENTATION CODES:   Obesity unspecified  INTERVENTION:   Increase Nepro to 50 ml/hr (1200 ml/day) via OG tube Decrease Prostat to 60 ml Prostat TID  Provides: 2760 kcal, 187 grams protein, 872 ml free water   NUTRITION DIAGNOSIS:   Increased nutrient needs related to acute illness(COVID-19) as evidenced by estimated needs. Ongoing.   GOAL:   Patient will meet greater than or equal to 90% of their needs Progressing.   MONITOR:   TF tolerance, Labs  REASON FOR ASSESSMENT:   Consult, Ventilator Enteral/tube feeding initiation and management  ASSESSMENT:   Pt with PMH of DM, CKD, HTN, HLD, CAD, PAD, GIB who presented to OSH 1/13 for hypoxia dx with COVID-19. Required intubation 1/24 and tx to [place] 1/25.   Pt has completed decadron and remdesivir Per MD renal function improving.  No longer in prone position.   Patient is currently intubated on ventilator support MV: 14.9 L/min Temp (24hrs), Avg:98.6 F (37 C), Min:97.8 F (36.6 C), Max:99.1 F (37.3 C)  Medications reviewed and include: SSI, 8 units novolog every 4 hours, 10 units levemir BID, solumedrol, 30 mEq KCl TID lasix Labs reviewed: K+ 3.4 (L), PO4: 8.7 (H) CBG's: 341-371  GG:YIRSW @ 40 ml/hr with 60 ml Prostat QID Provides: 2528 kcal and 197 grams protein   Diet Order:   Diet Order            Diet NPO time specified  Diet effective now              EDUCATION NEEDS:   No education needs have been identified at this time  Skin:  Skin Assessment: Reviewed RN Assessment  Last BM:  570 ml via rectal tube x 24 hrs  Height:   Ht Readings from Last 1 Encounters:  10/28/2019 6\' 1"  (1.854 m)    Weight:   Wt Readings from Last 1 Encounters:  11/07/19 124 kg    Ideal Body Weight:  83.6 kg  BMI:  Body mass index is 36.07 kg/m.  Estimated Nutritional Needs:   Kcal:  5462-7035  Protein:  167-209 grams  Fluid:  2 L/day  Maylon Peppers RD, LDN, CNSC [phone]  Pager [phone] After Hours Pager

## 2019-11-09 ENCOUNTER — Other Ambulatory Visit: Payer: Self-pay

## 2019-11-09 ENCOUNTER — Inpatient Hospital Stay (HOSPITAL_COMMUNITY): Payer: Medicare Other

## 2019-11-09 LAB — COMPREHENSIVE METABOLIC PANEL
ALT: 17 U/L (ref 0–44)
AST: 23 U/L (ref 15–41)
Albumin: 2.3 g/dL — ABNORMAL LOW (ref 3.5–5.0)
Alkaline Phosphatase: 84 U/L (ref 38–126)
Anion gap: 10 (ref 5–15)
BUN: 143 mg/dL — ABNORMAL HIGH (ref 8–23)
CO2: 34 mmol/L — ABNORMAL HIGH (ref 22–32)
Calcium: 7.9 mg/dL — ABNORMAL LOW (ref 8.9–10.3)
Chloride: 102 mmol/L (ref 98–111)
Creatinine, Ser: 3.18 mg/dL — ABNORMAL HIGH (ref 0.61–1.24)
GFR calc Af Amer: 21 mL/min — ABNORMAL LOW (ref >60)
GFR calc non Af Amer: 18 mL/min — ABNORMAL LOW (ref >60)
Glucose, Bld: 357 mg/dL — ABNORMAL HIGH (ref 70–99)
Potassium: 3.2 mmol/L — ABNORMAL LOW (ref 3.5–5.1)
Sodium: 146 mmol/L — ABNORMAL HIGH (ref 135–145)
Total Bilirubin: 0.7 mg/dL (ref 0.3–1.2)
Total Protein: 5.5 g/dL — ABNORMAL LOW (ref 6.5–8.1)

## 2019-11-09 LAB — GLUCOSE, CAPILLARY
Glucose-Capillary: 336 mg/dL — ABNORMAL HIGH (ref 70–99)
Glucose-Capillary: 344 mg/dL — ABNORMAL HIGH (ref 70–99)
Glucose-Capillary: 376 mg/dL — ABNORMAL HIGH (ref 70–99)
Glucose-Capillary: 393 mg/dL — ABNORMAL HIGH (ref 70–99)
Glucose-Capillary: 366 mg/dL — ABNORMAL HIGH (ref 70–99)
Glucose-Capillary: 302 mg/dL — ABNORMAL HIGH (ref 70–99)

## 2019-11-09 LAB — CBC
HCT: 24.4 % — ABNORMAL LOW (ref 39.0–52.0)
Hemoglobin: 7.1 g/dL — ABNORMAL LOW (ref 13.0–17.0)
MCH: 28.5 pg (ref 26.0–34.0)
MCHC: 29.1 g/dL — ABNORMAL LOW (ref 30.0–36.0)
MCV: 98 fL (ref 80.0–100.0)
Platelets: 176 10*3/uL (ref 150–400)
RBC: 2.49 MIL/uL — ABNORMAL LOW (ref 4.22–5.81)
RDW: 17.2 % — ABNORMAL HIGH (ref 11.5–15.5)
WBC: 17.8 10*3/uL — ABNORMAL HIGH (ref 4.0–10.5)
nRBC: 1 % — ABNORMAL HIGH (ref 0.0–0.2)

## 2019-11-09 MED ORDER — POTASSIUM CHLORIDE 20 MEQ PO PACK
40.0000 meq | PACK | ORAL | Status: AC
  Administered 2019-11-09 (×2): 40 meq
  Filled 2019-11-09 (×2): qty 2

## 2019-11-09 MED ORDER — FUROSEMIDE 10 MG/ML IJ SOLN
80.0000 mg | Freq: Two times a day (BID) | INTRAMUSCULAR | Status: DC
  Administered 2019-11-09 – 2019-11-10 (×2): 80 mg via INTRAVENOUS
  Filled 2019-11-09 (×2): qty 8

## 2019-11-09 MED ORDER — INSULIN DETEMIR 100 UNIT/ML ~~LOC~~ SOLN
15.0000 [IU] | Freq: Two times a day (BID) | SUBCUTANEOUS | Status: DC
  Administered 2019-11-09 – 2019-11-10 (×2): 15 [IU] via SUBCUTANEOUS
  Filled 2019-11-09 (×3): qty 0.15

## 2019-11-09 NOTE — Progress Notes (Signed)
Wife called for update 1/29 - reviewed O2 needs, sedation, current exam and labs.  All questions answered.   Noe Gens, MSN, NP-C [facility] 11/09/2019, 4:08 PM   Please see [email or web address] for pager details.

## 2019-11-09 NOTE — Progress Notes (Signed)
Patient has had no urine output for morning shift.  Scanned patient bladder to reveal volume of 441ml.  Notified physician of need for indwelling foley catheter.

## 2019-11-09 NOTE — Progress Notes (Signed)
Spoke with patient's grandson, Linton Rump. All questions and concerns addressed. Appreciative of update.

## 2019-11-09 NOTE — Progress Notes (Signed)
[facility] Progress Note  Subjective: good response to IV lasix , 3.9 L UOP yest and net neg 1.7 L.  FiO2 improved to 40% today.   Vitals:   11/09/19 1200 11/09/19 1215 11/09/19 1300 11/09/19 1400  BP: (!) 160/64  (!) 167/57 (!) 155/72  Pulse: 95  (!) 49 (!) 52  Resp: 19 (!) 23 20 (!) 21  Temp: (!) 96.6 F (35.9 C)     TempSrc: Axillary     SpO2: 94%  95% 94%  Weight:      Height:        Exam:   Gen on vent, proned, intubated and sedated No rash, cyanosis or gangrene No jvd or bruits Chest clear bilat post / lat RRR no RG Abd not done due to proning GU deferred, foley draining clear yellowish urine MS no joint effusions or deformity Ext diffuse 1-2+ UE / LE edema Neuro as above     Home meds:  - amlodipine 10/ benazepril 25 bid/ carvedilol 25 bid  - aspirin 81/ atorvastatin 40 hs/ gemfibrozil 600 bid  - humalog 75/25 35u sq bid  - naproxen 220-440 bid prn  - famotidine 20 bid     UA 1/25 > turbid, large Hb, rbc >50, wbc 6-10, bact few, prot 100    UNa <10, UCr 147 on 10/16/2019     Renal US 1/26 > 12-14 cm kidneys w/o hydronephrosis     CXR 1/27 > IMPRESSION: Persistent bilateral mixed interstitial and airspace opacities throughout both lungs. Minimal improvement in the left lung base     BP's normal to high since arrival      OSH - creat was 1.9- 2.7       FiO2 70%  Assessment/ Plan: 1. AKI / renal failure - may have underlying CKD. Creat at outside hospital was 1.9- 2.7.  Renal US w/ nl kidneys. Urine lytes showed low FeNa. May be cardiorenal component. Started IV lasix, good response, creat down a bit, BUN up.  Lower lasix to 80 iv bid. Will follow.  2. BP/volume - no pressors, BP's up. Vol ^ improving 3. COVID +PNA - on vent, sp remdesivir/ decadron at outside hospital prior to tx 4. H/o HTN - BP's on higher side, home meds are on hold; avoid acei/ arb 5. DM on insulin   6. H/o CAD sp stent 7. H/o prostate cancer      [name] 11/09/2019, 3:22 PM  Inpatient medications: . chlorhexidine gluconate (MEDLINE KIT)  15 mL Mouth Rinse BID  . Chlorhexidine Gluconate Cloth  6 each Topical Daily  . famotidine  20 mg Per Tube Daily  . feeding supplement (PRO-STAT SUGAR FREE 64)  60 mL Per Tube TID  . furosemide  80 mg Intravenous Q12H  . heparin injection (subcutaneous)  10,000 Units Subcutaneous Q8H  . insulin aspart  0-20 Units Subcutaneous Q4H  . insulin aspart  8 Units Subcutaneous Q4H  . insulin detemir  15 Units Subcutaneous BID  . mouth rinse  15 mL Mouth Rinse 10 times per day  . methylPREDNISolone (SOLU-MEDROL) injection  40 mg Intravenous Q8H  . potassium chloride  40 mEq Per Tube Q4H   . feeding supplement (NEPRO CARB STEADY) 50 mL/hr at 11/08/19 1500  . fentaNYL infusion INTRAVENOUS 150 mcg/hr (11/09/19 1300)  . midazolam 3 mg/hr (11/09/19 1300)   acetaminophen (TYLENOL) oral liquid 160 mg/5 mL, docusate, fentaNYL, midazolam, midazolam Recent Labs  Lab 11/07/19 0400 11/07/19 1639 11/07/19 1700 11/08/19 0500  11/08/19 1700 11/08/19 1700 11/08/19 1809 11/09/19 0420  NA 141   < >  --    < > 147*   < > 145 146*  K 4.6   < >  --    < > 3.5   < > 3.3* 3.2*  CL 104  --   --    < > 103  --   --  102  CO2 26  --   --    < > 31  --   --  34*  GLUCOSE 225*  --   --    < > 365*  --   --  357*  BUN 92*  --   --    < > 121*  --   --  143*  CREATININE 3.82*  --   --    < > 3.07*  --   --  3.18*  CALCIUM 7.5*  --   --    < > 7.6*  --   --  7.9*  PHOS 7.6*  --  8.7*  --   --   --   --   --    < > = values in this interval not displayed.

## 2019-11-09 NOTE — Progress Notes (Signed)
Patient remains on Fentanyl drip at 158mcg/Hr and Versed infusing 3mg /hr.  His RAS score is -2 as he moves his arms upon stimulation with turning and ETT suctioning.  Suctioning is difficult as this patient bites down hard on his bite block causing for impedence to pass catheter down tube.  His vitals are WDL SBP 157 MAP 91, HR 96, RR 20. He has course crackles to his bilateral lung fields.His peripheral pulses are +1 d/t his +1 pitting edema to his lower legs and feet.  Heart sound is S1S2. Recent Hx of Non-stemi while seeking tx at [facility]. He is on a ventilator PRVC at 40% FiO2 Peep 10, rate 20, with tidal volume 560 saturating at 91%. The tube is 27cm at the lips. He is receiving continuous tube feeds through his O/G of Nepro carb steady @ goal 62ml/hr. Night shift RN reported patient has had to be straight cath 3 times during the shift while receiving IV infusion of lasix.  If this intervention persist d/t patient urinary obstruction I will notify medical staff to place foley cath.  Patient also has rectal tube in place and is passing gas. His stool color is light brown and liquid.

## 2019-11-10 ENCOUNTER — Inpatient Hospital Stay (HOSPITAL_COMMUNITY): Payer: Medicare Other

## 2019-11-10 DIAGNOSIS — K920 Hematemesis: Secondary | ICD-10-CM

## 2019-11-10 LAB — CBC WITH DIFFERENTIAL/PLATELET
Abs Immature Granulocytes: 0.24 10*3/uL — ABNORMAL HIGH (ref 0.00–0.07)
Basophils Absolute: 0 10*3/uL (ref 0.0–0.1)
Basophils Relative: 0 %
Eosinophils Absolute: 0 10*3/uL (ref 0.0–0.5)
Eosinophils Relative: 0 %
HCT: 24.3 % — ABNORMAL LOW (ref 39.0–52.0)
Hemoglobin: 7.1 g/dL — ABNORMAL LOW (ref 13.0–17.0)
Immature Granulocytes: 1 %
Lymphocytes Relative: 2 %
Lymphs Abs: 0.4 10*3/uL — ABNORMAL LOW (ref 0.7–4.0)
MCH: 29.5 pg (ref 26.0–34.0)
MCHC: 29.2 g/dL — ABNORMAL LOW (ref 30.0–36.0)
MCV: 100.8 fL — ABNORMAL HIGH (ref 80.0–100.0)
Monocytes Absolute: 1 10*3/uL (ref 0.1–1.0)
Monocytes Relative: 5 %
Neutro Abs: 19.6 10*3/uL — ABNORMAL HIGH (ref 1.7–7.7)
Neutrophils Relative %: 92 %
Platelets: 172 10*3/uL (ref 150–400)
RBC: 2.41 MIL/uL — ABNORMAL LOW (ref 4.22–5.81)
RDW: 18.5 % — ABNORMAL HIGH (ref 11.5–15.5)
WBC: 21.2 10*3/uL — ABNORMAL HIGH (ref 4.0–10.5)
nRBC: 1.5 % — ABNORMAL HIGH (ref 0.0–0.2)

## 2019-11-10 LAB — CULTURE, BLOOD (ROUTINE X 2)
Culture: NO GROWTH
Culture: NO GROWTH
Special Requests: ADEQUATE
Special Requests: ADEQUATE

## 2019-11-10 LAB — COMPREHENSIVE METABOLIC PANEL
ALT: 17 U/L (ref 0–44)
AST: 24 U/L (ref 15–41)
Albumin: 2.1 g/dL — ABNORMAL LOW (ref 3.5–5.0)
Alkaline Phosphatase: 76 U/L (ref 38–126)
Anion gap: 10 (ref 5–15)
BUN: 157 mg/dL — ABNORMAL HIGH (ref 8–23)
CO2: 33 mmol/L — ABNORMAL HIGH (ref 22–32)
Calcium: 8.2 mg/dL — ABNORMAL LOW (ref 8.9–10.3)
Chloride: 108 mmol/L (ref 98–111)
Creatinine, Ser: 3.06 mg/dL — ABNORMAL HIGH (ref 0.61–1.24)
GFR calc Af Amer: 22 mL/min — ABNORMAL LOW (ref >60)
GFR calc non Af Amer: 19 mL/min — ABNORMAL LOW (ref >60)
Glucose, Bld: 258 mg/dL — ABNORMAL HIGH (ref 70–99)
Potassium: 3.8 mmol/L (ref 3.5–5.1)
Sodium: 151 mmol/L — ABNORMAL HIGH (ref 135–145)
Total Bilirubin: 0.3 mg/dL (ref 0.3–1.2)
Total Protein: 5.4 g/dL — ABNORMAL LOW (ref 6.5–8.1)

## 2019-11-10 LAB — CBC
HCT: 16.9 % — ABNORMAL LOW (ref 39.0–52.0)
Hemoglobin: 4.6 g/dL — CL (ref 13.0–17.0)
MCH: 29.1 pg (ref 26.0–34.0)
MCHC: 27.2 g/dL — ABNORMAL LOW (ref 30.0–36.0)
MCV: 107 fL — ABNORMAL HIGH (ref 80.0–100.0)
Platelets: 125 10*3/uL — ABNORMAL LOW (ref 150–400)
RBC: 1.58 MIL/uL — ABNORMAL LOW (ref 4.22–5.81)
RDW: 19.3 % — ABNORMAL HIGH (ref 11.5–15.5)
WBC: 21.2 10*3/uL — ABNORMAL HIGH (ref 4.0–10.5)
nRBC: 4.4 % — ABNORMAL HIGH (ref 0.0–0.2)

## 2019-11-10 LAB — GLUCOSE, CAPILLARY
Glucose-Capillary: 240 mg/dL — ABNORMAL HIGH (ref 70–99)
Glucose-Capillary: 256 mg/dL — ABNORMAL HIGH (ref 70–99)
Glucose-Capillary: 276 mg/dL — ABNORMAL HIGH (ref 70–99)
Glucose-Capillary: 310 mg/dL — ABNORMAL HIGH (ref 70–99)
Glucose-Capillary: 287 mg/dL — ABNORMAL HIGH (ref 70–99)

## 2019-11-10 LAB — D-DIMER, QUANTITATIVE: D-Dimer, Quant: 1.72 ug/mL-FEU — ABNORMAL HIGH (ref 0.00–0.50)

## 2019-11-10 LAB — PREPARE RBC (CROSSMATCH)

## 2019-11-10 MED ORDER — FREE WATER
250.0000 mL | Status: DC
  Administered 2019-11-10 (×2): 250 mL

## 2019-11-10 MED ORDER — SODIUM CHLORIDE 0.9% IV SOLUTION: Freq: Once | INTRAVENOUS | Status: AC

## 2019-11-10 MED ORDER — ROCURONIUM BROMIDE 10 MG/ML (PF) SYRINGE
PREFILLED_SYRINGE | INTRAVENOUS | Status: AC
  Filled 2019-11-10: qty 10

## 2019-11-10 MED ORDER — SODIUM CHLORIDE 0.9% FLUSH: 10.0000 mL | INTRAVENOUS | Status: DC | PRN

## 2019-11-10 MED ORDER — SODIUM CHLORIDE 0.9 % IV SOLN
80.0000 mg | Freq: Once | INTRAVENOUS | Status: AC
  Administered 2019-11-10: 80 mg via INTRAVENOUS
  Filled 2019-11-10: qty 80

## 2019-11-10 MED ORDER — PANTOPRAZOLE SODIUM 40 MG IV SOLR: 40.0000 mg | Freq: Two times a day (BID) | INTRAVENOUS | Status: DC

## 2019-11-10 MED ORDER — LACTATED RINGERS IV BOLUS
500.0000 mL | Freq: Once | INTRAVENOUS | Status: AC
  Administered 2019-11-10: 500 mL via INTRAVENOUS

## 2019-11-10 MED ORDER — SODIUM CHLORIDE 0.9 % IV BOLUS
1000.0000 mL | Freq: Once | INTRAVENOUS | Status: AC
  Administered 2019-11-10: 1000 mL via INTRAVENOUS

## 2019-11-10 MED ORDER — SODIUM CHLORIDE 0.9 % IV SOLN
8.0000 mg/h | INTRAVENOUS | Status: DC
  Administered 2019-11-10 – 2019-11-13 (×7): 8 mg/h via INTRAVENOUS
  Filled 2019-11-10 (×13): qty 80

## 2019-11-10 MED ORDER — INSULIN DETEMIR 100 UNIT/ML ~~LOC~~ SOLN
20.0000 [IU] | Freq: Two times a day (BID) | SUBCUTANEOUS | Status: DC
  Administered 2019-11-10: 20 [IU] via SUBCUTANEOUS
  Filled 2019-11-10 (×3): qty 0.2

## 2019-11-10 MED ORDER — FUROSEMIDE 10 MG/ML IJ SOLN: 40.0000 mg | Freq: Two times a day (BID) | INTRAMUSCULAR | Status: DC

## 2019-11-10 MED ORDER — DEXMEDETOMIDINE HCL IN NACL 400 MCG/100ML IV SOLN
0.4000 ug/kg/h | INTRAVENOUS | Status: DC
  Administered 2019-11-10: 0.4 ug/kg/h via INTRAVENOUS
  Filled 2019-11-10: qty 100

## 2019-11-10 MED ORDER — NOREPINEPHRINE 4 MG/250ML-% IV SOLN
0.0000 ug/min | INTRAVENOUS | Status: DC
  Administered 2019-11-10: 5 ug/min via INTRAVENOUS
  Filled 2019-11-10: qty 250

## 2019-11-10 MED ORDER — SODIUM CHLORIDE 0.9% FLUSH
10.0000 mL | Freq: Two times a day (BID) | INTRAVENOUS | Status: DC
  Administered 2019-11-10 – 2019-11-14 (×8): 10 mL

## 2019-11-10 MED ORDER — FREE WATER
300.0000 mL | Status: DC
  Administered 2019-11-10: 300 mL

## 2019-11-10 NOTE — Progress Notes (Signed)
[facility] Progress Note  Subjective: 2.9L UOP yest w/ 80 bid IV lasix. Na ^151, BUN 157 and creat down 3.06.   Vitals:   11/10/19 1600 11/10/19 1700 11/10/19 1800 11/10/19 1900  BP: 103/63 (!) 87/54 (!) 76/62 (!) 84/52  Pulse: (!) 109 78 (!) 31 (!) 103  Resp: _0 Temp:      TempSrc:      SpO2: 100% 98% (!) 43% 100%  Weight:      Height:        Exam:  Patient not examined today directly given COVID-19 + status, utilizing data taken from chart +/- discussions w/ providers and staff.       Home meds:  - amlodipine 10/ benazepril 25 bid/ carvedilol 25 bid  - aspirin 81/ atorvastatin 40 hs/ gemfibrozil 600 bid  - humalog 75/25 35u sq bid  - naproxen 220-440 bid prn  - famotidine 20 bid     UA 1/25 > turbid, large Hb, rbc >50, wbc 6-10, bact few, prot 100    UNa <10, UCr 147 on 10/16/2019     Renal US 1/26 > 12-14 cm kidneys w/o hydronephrosis     CXR 1/27 > IMPRESSION: Persistent bilateral mixed interstitial and airspace opacities throughout both lungs. Minimal improvement in the left lung base     BP's normal to high since arrival      OSH - creat was 1.9- 2.7       FiO2 70%       I/O yest 1.3L in and 3.0 L out = neg 1.7 L  Assessment/ Plan: 1. AKI / renal failure - may have underlying CKD. Creat at outside hospital was 1.9- 2.7.  Renal US w/ nl kidneys. Urine lytes showed low FeNa. May be cardiorenal component.  - w/ volume overload started IV lasix 1/28 > lowered to 80 iv bid 1/29 - diuresing and creat down, but Na+ and BUN up > lower to 40 lasix iv bid, may have to dc soon if BP's cont to drop - BP's down, started levo gtt today low dose  2. BP/volume - as above 3. COVID +PNA - on vent, sp remdesivir/ decadron at outside hospital prior to tx 4. H/o HTN - BP's on higher side, home meds are on hold; avoid acei/ arb 5. DM on insulin   6. H/o CAD sp stent 7. H/o prostate cancer    [name] 11/10/2019, 8:20 PM  Inpatient medications: .  chlorhexidine gluconate (MEDLINE KIT)  15 mL Mouth Rinse BID  . Chlorhexidine Gluconate Cloth  6 each Topical Daily  . free water  300 mL Per Tube Q4H  . insulin aspart  0-20 Units Subcutaneous Q4H  . insulin aspart  8 Units Subcutaneous Q4H  . insulin detemir  20 Units Subcutaneous BID  . mouth rinse  15 mL Mouth Rinse 10 times per day  . [START ON 11/24/19] pantoprazole  40 mg Intravenous Q12H  . sodium chloride flush  10-40 mL Intracatheter Q12H   . dexmedetomidine (PRECEDEX) IV infusion Stopped (11/10/19 1808)  . fentaNYL infusion INTRAVENOUS 100 mcg/hr (11/10/19 1900)  . norepinephrine (LEVOPHED) Adult infusion 5 mcg/min (11/10/19 1900)  . pantoprazole (PROTONIX) IVPB    . pantoprozole (PROTONIX) infusion     acetaminophen (TYLENOL) oral liquid 160 mg/5 mL, docusate, fentaNYL, midazolam, midazolam, sodium chloride flush Recent Labs  Lab 11/07/19 0400 11/07/19 1639 11/07/19 1700 11/08/19 0500 11/09/19 0420 11/10/19 0554  NA 141   < >  --    < >  146* 151*  K 4.6   < >  --    < > 3.2* 3.8  CL 104  --   --    < > 102 108  CO2 26  --   --    < > 34* 33*  GLUCOSE 225*  --   --    < > 357* 258*  BUN 92*  --   --    < > 143* 157*  CREATININE 3.82*  --   --    < > 3.18* 3.06*  CALCIUM 7.5*  --   --    < > 7.9* 8.2*  PHOS 7.6*  --  8.7*  --   --   --    < > = values in this interval not displayed.

## 2019-11-10 NOTE — Progress Notes (Signed)
Pt's grandson, Linton Rump, called and given update on Mr Lepak's condition and plan of care.

## 2019-11-10 NOTE — Progress Notes (Signed)
Dark red blood from NGT  Hypotension  Recommend - CBC, type & screen stat -transfuse 2 U PRBC -CBC q 6h -protonix gtt -If BP does not improve, start levophed gtt  Addn cc time x 66mins  Nyaira Hodgens V. Elsworth Soho MD

## 2019-11-10 NOTE — Progress Notes (Signed)
eLink Physician-Brief Progress Note Patient Name: Colton Fox DOB: 02-10-1946 MRN: [id number]   Date of Service  11/10/2019  HPI/Events of Note  Notified of active upper GI bleeding.  Pt waiting for blood to be transfused but RN suctioned about 250cc of bloody output.    eICU Interventions  Give 500cc LR stat.  Hgb 4.6 now.  Ordering 3rd unit pRBC.  Will consult GI.  Start on PPI gtt.     Intervention Category Major Interventions: Hemorrhage - evaluation and management  Elsie Lincoln 11/10/2019, 9:14 PM

## 2019-11-10 NOTE — Procedures (Signed)
Central Venous Catheter Insertion Procedure Note Tareq Dwan [id number] 1946/04/28  Procedure: Insertion of Central Venous Catheter Indications: Drug and/or fluid administration  Procedure Details Consent: Unable to obtain consent because of emergent medical necessity. Time Out: Verified patient identification, verified procedure, site/side was marked, verified correct patient position, special equipment/implants available, medications/allergies/relevent history reviewed, required imaging and test results available.  Performed  Maximum sterile technique was used including antiseptics, cap, gloves, gown, hand hygiene, mask and sheet. Skin prep: Chlorhexidine; local anesthetic administered A antimicrobial bonded/coated triple lumen catheter was placed in the right internal jugular vein using the Seldinger technique.  Evaluation Blood flow good Complications: No apparent complications Patient did tolerate procedure well. Chest X-ray ordered to verify placement.  CXR: pending.  Todd Jelinski Rodman Pickle 11/10/2019, 9:47 PM

## 2019-11-10 NOTE — Procedures (Signed)
Arterial Catheter Insertion Procedure Note Brandi Armato [id number] 1945/12/26  Procedure: Insertion of Arterial Catheter  Indications: Blood pressure monitoring  Procedure Details Consent: Unable to obtain consent because of emergent medical necessity. Time Out: Verified patient identification, verified procedure, site/side was marked, verified correct patient position, special equipment/implants available, medications/allergies/relevent history reviewed, required imaging and test results available.  Performed  Maximum sterile technique was used including antiseptics, cap, gloves, gown, hand hygiene, mask and sheet. Skin prep: Chlorhexidine; local anesthetic administered Radial catheter was inserted into right radial artery using the Seldinger technique. ULTRASOUND GUIDANCE USED: YES Evaluation Blood flow good; BP tracing good. Complications: No apparent complications.   Reiley Keisler Rodman Pickle 11/10/2019

## 2019-11-10 NOTE — Progress Notes (Signed)
40cc Versed wasted from infusion bottle into stericycle hazard bin with second RN, Laretta Alstrom, as witness.

## 2019-11-10 NOTE — Plan of Care (Signed)
Case discussed with Dr. James Ivanoff.  Patent admitted for COVID pneumonia and respiratory failure.  Developed blood per NGT with hypotension and Hgb 4.6.  Patient critically ill from many factors, now GI bleeding as well.  Patient at present is far too sick to scope.  I advised continued volume resuscitation  PPI, ongoing transfusion, check PT/INR.  If able to improve patient's hemodynamics, I would transfer patient to ICU at [facility] where we are better able to deal with emergent GI bleeding once the patient has been hemodynamically stabilized.

## 2019-11-10 NOTE — Progress Notes (Signed)
NAME:  Colton Fox, MRN:  [id number], DOB:  1946/05/18, LOS: 5 ADMISSION DATE:  11/08/2019, CONSULTATION DATE:  10/22/2019 REFERRING MD:  OSH, CHIEF COMPLAINT:  Acute hypoxemic resp failure 2/2 COVID  Brief History   74 y/o male, COVID positive who presented to OSH 1/13 after 4 days of feeling poorly, dx/d with COVID and acute hypoxic resp failure, NSTEMI and AKI on CKD.  He decompensated slowly to warrant ETT placement 1/24. Transferred to [facility] 1/25.   Past Medical History  DM  CKD  HTN HLD CAD s/p PCI PAD GIB Appendectomy Prostate Cancer  Significant Hospital Events   1/13 Admit to OSH with 4 day hx of feeling poorly, COVID + 1/24 Intubated at OSH  1/25 Tx to [facility], prone positioning  1/27 Nephrology consulted 1/28 Bicarb gtt stopped, modestly improved renal function. Hyperglycemia  Consults:    Procedures:  ETT 1/24 >>   Significant Diagnostic Tests:  Renal US 1/26 >> sub-optimal images but negative for hydronephrosis LE Venous Duplex 1/26 >> negative for DVT ECHO 1/26 >> LV with mild concentric hypertrophy, global hypokinesis, LVEF ~45-50%  Micro Data:  BCx2 1/25 >> ng MRSA PCXR 1/25 >> negative  UC 1/25 >> negative U. Strep Antigen 1/26 >> negative    Antimicrobials:  Remdesivir completed  Interim history/subjective:   Critically ill, intubated On 45%/PEEP of 10 Good urine output with Lasix Afebrile  Objective   Blood pressure 107/65, pulse (!) 110, temperature 98.6 F (37 C), temperature source Oral, resp. rate (!) 21, height 6\' 1"  (1.854 m), weight 116.6 kg, SpO2 (!) 86 %.    Vent Mode: PRVC FiO2 (%):  [40 %-45 %] 40 % Set Rate:  [20 bmp] 20 bmp Vt Set:  [560 mL] 560 mL PEEP:  [10 cmH20] 10 cmH20 Plateau Pressure:  [21 cmH20-26 cmH20] 23 cmH20   Intake/Output Summary (Last 24 hours) at 11/10/2019 1428 Last data filed at 11/10/2019 1400 Gross per 24 hour  Intake 1895.7 ml  Output 3020 ml  Net -1124.3 ml   Filed Weights   11/07/19 0500 11/09/19  0500 11/10/19 0350  Weight: 124 kg 121.6 kg 116.6 kg    Examination: General: Elderly, acutely ill appearing sedated intubated HEENT:ETT in place, mild pallor, no icterus Neuro: Sedated, eyes open but does not follow commands, RASS -1 GE:[id number] regular PULM:  Coarse B ventilated sounds GI: hypoactive BS Extremities: warm/dry, 1+ edema Skin: no rashes or lesions  Chest x-ray 1/29 personally reviewed which shows diffuse bilateral pulmonary infiltrates unchanged  Labs show increased sodium, rising BUN, stable creatinine, increasing leukocytosis, stable anemia  Resolved Hospital Problem list     Assessment & Plan:   ARDS secondary to COVID PNA   Mechanical ventilation via ARDS protocol, target PRVC 6 cc/kg Wean PEEP and FiO2 as able.  Goal plateau pressure less than 30, driving pressure less than 15 No indication for paralytics or proning at this time  Diuresis as blood pressure and renal function can tolerate, goal CVP 5-8.    VAP prevention order set Completed dexamethasone x10 days, additional Solu-Medrol  X 5ds given, can now discontinue steroids   Sedation Needs secondary to Mechanical Ventilation  PAD protocol, discontinue Versed, add Precedex and continue fentanyl drip RASS goal -1  HFrEF Poor images on ECHO but global hypokinesis  Daily diuresis   AKI on CKD  Sr Cr 2.5 at OSH.  Unclear baseline but was 1.67 in 2013.  FENa suggestive of pre-renal failure. Note proteinuria on UA.  Appreciate nephrology  management, Lasix 40 every 12 Follow urine output and BMP Replace electrolytes as indicated Avoid nephrotoxins, ensure adequate renal perfusion  Hyper natremia -Increase free water 300 q 4 - may need D5W once sugars controlled  Anemia  Follow CBC Conservative transfusion protocol for Hgb <7%  DM II -uncontrolled hyperglycemia due to steroids A1c 6.9 Sliding scale insulin as per protocol Tube feeding coverage 8 units every 4 hours Levemir increased to 20  units every 12 Expect to improve now that steroids DC'd  Hx HTN, HLD, CAD s/p PCI, PAD Home antihypertensive meds, cardiac meds on hold Telemetry  Best practice:  Diet: TF Pain/Anxiety/Delirium protocol (if indicated): protocol VAP protocol (if indicated): yes DVT prophylaxis: sq heparin per protocol GI prophylaxis: famotidine Glucose control: SSI Mobility: bedrest Code Status: Full Code  Family Communication:  Wife Arville Go) 1/30. Tyson [facility] Disposition: ICU  The patient is critically ill with multiple organ systems failure and requires high complexity decision making for assessment and support, frequent evaluation and titration of therapies, application of advanced monitoring technologies and extensive interpretation of multiple databases. Critical Care Time devoted to patient care services described in this note independent of APP/resident  time is 34 minutes.    Kara Mead MD. Shade Flood. [facility]  If no response to pager , please call [phone]   11/10/2019

## 2019-11-11 ENCOUNTER — Inpatient Hospital Stay (HOSPITAL_COMMUNITY): Payer: Medicare Other

## 2019-11-11 ENCOUNTER — Encounter (HOSPITAL_COMMUNITY): Admission: AD | Disposition: E | Payer: Self-pay | Attending: Emergency Medicine

## 2019-11-11 DIAGNOSIS — K250 Acute gastric ulcer with hemorrhage: Secondary | ICD-10-CM

## 2019-11-11 DIAGNOSIS — Z9911 Dependence on respirator [ventilator] status: Secondary | ICD-10-CM

## 2019-11-11 DIAGNOSIS — R578 Other shock: Secondary | ICD-10-CM

## 2019-11-11 DIAGNOSIS — K922 Gastrointestinal hemorrhage, unspecified: Secondary | ICD-10-CM

## 2019-11-11 HISTORY — PX: ESOPHAGOGASTRODUODENOSCOPY: SHX5428

## 2019-11-11 LAB — BASIC METABOLIC PANEL
Anion gap: 15 (ref 5–15)
BUN: 196 mg/dL — ABNORMAL HIGH (ref 8–23)
CO2: 29 mmol/L (ref 22–32)
Calcium: 7.8 mg/dL — ABNORMAL LOW (ref 8.9–10.3)
Chloride: 110 mmol/L (ref 98–111)
Creatinine, Ser: 3.6 mg/dL — ABNORMAL HIGH (ref 0.61–1.24)
GFR calc Af Amer: 18 mL/min — ABNORMAL LOW (ref >60)
GFR calc non Af Amer: 16 mL/min — ABNORMAL LOW (ref >60)
Glucose, Bld: 167 mg/dL — ABNORMAL HIGH (ref 70–99)
Potassium: 3.9 mmol/L (ref 3.5–5.1)
Sodium: 154 mmol/L — ABNORMAL HIGH (ref 135–145)

## 2019-11-11 LAB — CBC
HCT: 24.1 % — ABNORMAL LOW (ref 39.0–52.0)
HCT: 25.8 % — ABNORMAL LOW (ref 39.0–52.0)
HCT: 24.8 % — ABNORMAL LOW (ref 39.0–52.0)
Hemoglobin: 7.6 g/dL — ABNORMAL LOW (ref 13.0–17.0)
Hemoglobin: 8.3 g/dL — ABNORMAL LOW (ref 13.0–17.0)
Hemoglobin: 7.8 g/dL — ABNORMAL LOW (ref 13.0–17.0)
MCH: 30.6 pg (ref 26.0–34.0)
MCH: 30.6 pg (ref 26.0–34.0)
MCH: 30.1 pg (ref 26.0–34.0)
MCHC: 31.5 g/dL (ref 30.0–36.0)
MCHC: 32.2 g/dL (ref 30.0–36.0)
MCHC: 31.5 g/dL (ref 30.0–36.0)
MCV: 97.2 fL (ref 80.0–100.0)
MCV: 95.2 fL (ref 80.0–100.0)
MCV: 95.8 fL (ref 80.0–100.0)
Platelets: 160 10*3/uL (ref 150–400)
Platelets: 153 10*3/uL (ref 150–400)
Platelets: 129 10*3/uL — ABNORMAL LOW (ref 150–400)
RBC: 2.48 MIL/uL — ABNORMAL LOW (ref 4.22–5.81)
RBC: 2.71 MIL/uL — ABNORMAL LOW (ref 4.22–5.81)
RBC: 2.59 MIL/uL — ABNORMAL LOW (ref 4.22–5.81)
RDW: 15.9 % — ABNORMAL HIGH (ref 11.5–15.5)
RDW: 17.3 % — ABNORMAL HIGH (ref 11.5–15.5)
RDW: 18 % — ABNORMAL HIGH (ref 11.5–15.5)
WBC: 27.5 10*3/uL — ABNORMAL HIGH (ref 4.0–10.5)
WBC: 31.7 10*3/uL — ABNORMAL HIGH (ref 4.0–10.5)
WBC: 24.2 10*3/uL — ABNORMAL HIGH (ref 4.0–10.5)
nRBC: 5.3 % — ABNORMAL HIGH (ref 0.0–0.2)
nRBC: 4.8 % — ABNORMAL HIGH (ref 0.0–0.2)
nRBC: 3.7 % — ABNORMAL HIGH (ref 0.0–0.2)

## 2019-11-11 LAB — GLUCOSE, CAPILLARY
Glucose-Capillary: 128 mg/dL — ABNORMAL HIGH (ref 70–99)
Glucose-Capillary: 144 mg/dL — ABNORMAL HIGH (ref 70–99)
Glucose-Capillary: 169 mg/dL — ABNORMAL HIGH (ref 70–99)
Glucose-Capillary: 266 mg/dL — ABNORMAL HIGH (ref 70–99)
Glucose-Capillary: 273 mg/dL — ABNORMAL HIGH (ref 70–99)
Glucose-Capillary: 73 mg/dL (ref 70–99)
Glucose-Capillary: 82 mg/dL (ref 70–99)
Glucose-Capillary: 94 mg/dL (ref 70–99)

## 2019-11-11 LAB — PROTIME-INR
INR: 1.5 — ABNORMAL HIGH (ref 0.8–1.2)
Prothrombin Time: 17.9 seconds — ABNORMAL HIGH (ref 11.4–15.2)

## 2019-11-11 LAB — ABO/RH: ABO/RH(D): A POS

## 2019-11-11 SURGERY — EGD (ESOPHAGOGASTRODUODENOSCOPY)
Anesthesia: Moderate Sedation | Laterality: Left

## 2019-11-11 MED ORDER — FENTANYL CITRATE (PF) 100 MCG/2ML IJ SOLN
INTRAMUSCULAR | Status: AC
  Filled 2019-11-11: qty 4

## 2019-11-11 MED ORDER — SODIUM CHLORIDE 0.9 % IV SOLN: INTRAVENOUS | Status: DC

## 2019-11-11 MED ORDER — DEXTROSE 10 % IV SOLN: INTRAVENOUS | Status: DC

## 2019-11-11 MED ORDER — MIDAZOLAM HCL (PF) 5 MG/ML IJ SOLN
INTRAMUSCULAR | Status: AC
  Filled 2019-11-11: qty 2

## 2019-11-11 MED ORDER — METOCLOPRAMIDE HCL 5 MG/ML IJ SOLN: 10.0000 mg | Freq: Once | INTRAMUSCULAR | Status: DC

## 2019-11-11 NOTE — Progress Notes (Addendum)
[facility] Progress Note  Subjective: 1.3 L UOP yest , IV lasix dc'd yest, net I/O +2L yesterday. BUN up 196 and creat up 3.6 today from 3.0 yest.  Not on pressors.  Had EGD today for acute UGIB.  Na+ up 154.    Vitals:   11/03/2019 0732 10/28/2019 0800 10/15/2019 1123 10/21/2019 1154  BP: (!) 110/42  (!) 101/46 (!) 96/46  Pulse: (!) 110 (!) 111 (!) 112 (!) 113  Resp: (!) 28 16 (!) 25 18  Temp:      TempSrc:      SpO2: 100%  93% 93%  Weight:      Height:        Exam: Gen on vent intubated and sedated No rash, cyanosis or gangrene No jvd or bruits Chest clear ant/ lat RRR no RG Abd obese no ascites GU foley draining clear yellowish urine MS no joint effusions or deformity Ext diffuse 2+ UE / LE edema, no wounds or ulcers Neuro as above     Home meds:  - amlodipine 10/ benazepril 25 bid/ carvedilol 25 bid  - aspirin 81/ atorvastatin 40 hs/ gemfibrozil 600 bid  - humalog 75/25 35u sq bid  - naproxen 220-440 bid prn  - famotidine 20 bid     UA 1/25 > turbid, large Hb, rbc >50, wbc 6-10, bact few, prot 100    UNa <10, UCr 147 on 11/02/2019     Renal US 1/26 > 12-14 cm kidneys w/o hydronephrosis     CXR 1/27 > IMPRESSION: Persistent bilateral mixed interstitial and airspace opacities throughout both lungs. Minimal improvement in the left lung base      CXR 1/31 > no sig change from prior     BP's soft yest and today,  No pressors      OSH - creat was 1.9- 2.7       FiO2 60%    Assessment/ Plan: 1. AKI / renal failure - may have underlying CKD. Creat at outside hospital was 1.9- 2.7.  Renal US w/ nl kidneys. Urine lytes showed low FeNa. May be cardiorenal component.  - sp course IV lasix, dc'd yest for rising B/Cr  - renal function worsening azotemia BUN 196, creat 3.6, still making urine, CVP low, borderline for needing CRRT. BUN high but some of that is steroids/ GIB related. Have d/w CCM - no strong indication, will hold off for now.  - BP's lower, not on  pressors   2. BP/volume - as above 3. COVID +PNA - on vent, sp remdesivir/ decadron at outside hospital prior to tx 4. H/o HTN - BP's on higher side, home meds are on hold; avoid acei/ arb 5. DM on insulin   6. H/o CAD sp stent 7. H/o prostate cancer    [name] 10/21/2019, 2:46 PM  Inpatient medications: . chlorhexidine gluconate (MEDLINE KIT)  15 mL Mouth Rinse BID  . Chlorhexidine Gluconate Cloth  6 each Topical Daily  . free water  300 mL Per Tube Q4H  . insulin aspart  0-20 Units Subcutaneous Q4H  . insulin aspart  8 Units Subcutaneous Q4H  . mouth rinse  15 mL Mouth Rinse 10 times per day  . metoCLOPramide (REGLAN) injection  10 mg Intravenous Once  . [START ON 12/10/19] pantoprazole  40 mg Intravenous Q12H  . sodium chloride flush  10-40 mL Intracatheter Q12H   . dextrose    . fentaNYL infusion INTRAVENOUS 125 mcg/hr (10/28/2019 0921)  . pantoprozole (PROTONIX) infusion  8 mg/hr (10/13/2019 0814)   acetaminophen (TYLENOL) oral liquid 160 mg/5 mL, docusate, fentaNYL, midazolam, midazolam, sodium chloride flush Recent Labs  Lab 11/07/19 0400 11/07/19 1639 11/07/19 1700 11/08/19 0500 11/10/19 0554 10/18/2019 0555  NA 141   < >  --    < > 151* 154*  K 4.6   < >  --    < > 3.8 3.9  CL 104  --   --    < > 108 110  CO2 26  --   --    < > 33* 29  GLUCOSE 225*  --   --    < > 258* 167*  BUN 92*  --   --    < > 157* 196*  CREATININE 3.82*  --   --    < > 3.06* 3.60*  CALCIUM 7.5*  --   --    < > 8.2* 7.8*  PHOS 7.6*  --  8.7*  --   --   --    < > = values in this interval not displayed.

## 2019-11-11 NOTE — Op Note (Signed)
[facility] Patient Name: Colton Fox Procedure Date : 11/05/2019 MRN: [id number] Attending MD: Arta Silence , MD Date of Birth: 1946/01/03 CSN: [id number] Age: 74 Admit Type: Inpatient Procedure:                Upper GI endoscopy Indications:              Acute post hemorrhagic anemia, Hematemesis Providers:                Arta Silence, MD, Laverda Sorenson, Technician,                            Grace Isaac, RN Referring MD:             PCCM Medicines:                General Anesthesia Complications:            No immediate complications. Estimated Blood Loss:     Estimated blood loss was minimal. Procedure:                Pre-Anesthesia Assessment:                           - Prior to the procedure, a History and Physical                            was performed, and patient medications and                            allergies were reviewed. The patient's tolerance of                            previous anesthesia was also reviewed. The risks                            and benefits of the procedure and the sedation                            options and risks were discussed with the patient.                            All questions were answered, and informed consent                            was obtained. Prior Anticoagulants: The patient has                            taken no previous anticoagulant or antiplatelet                            agents. ASA Grade Assessment: IV - A patient with                            severe systemic disease that is a constant threat  to life. After reviewing the risks and benefits,                            the patient was deemed in satisfactory condition to                            undergo the procedure.                           After obtaining informed consent, the endoscope was                            passed under direct vision. Throughout the                            procedure, the  patient's blood pressure, pulse, and                            oxygen saturations were monitored continuously. The                            GIF-H190 ([id number]) Olympus gastroscope was                            introduced through the mouth, and advanced to the                            second part of duodenum. The upper GI endoscopy was                            accomplished without difficulty. The patient                            tolerated the procedure well. Scope In: Scope Out: Findings:      LA Grade B (one or more mucosal breaks greater than 5 mm, not extending       between the tops of two mucosal folds) esophagitis was found.      A small hiatal hernia was present.      The exam of the esophagus was otherwise normal.      Large plum-sized organized old blood clot was found in the cardia, in       the gastric fundus and in the gastric body. Views of much of stomach       significantly compromised.      One non-bleeding cratered gastric ulcer with adherent clot +/- visible       vessel was found at the incisura. The lesion was 15 mm in largest       dimension. Limited views due to neighboring large clot and difficult       position to view lesion in retroflexion; risks of endoscopic therapy in       this setting seemed greater than benefit.      The exam of the stomach was otherwise normal.      The duodenal bulb, first portion of the duodenum and second portion of       the duodenum were normal.  Impression:               - LA Grade B esophagitis.                           - Small hiatal hernia.                           - Large plum-sized organized old blood clot in the                            cardia, in the gastric fundus and in the gastric                            body.                           - Non-bleeding gastric ulcer with adherent clot,                            highly likely source of patient's GI bleeding.                            Risks of endoscopic  therapy greater than benefits,                            for reasons as described above.                           - Normal duodenal bulb, first portion of the                            duodenum and second portion of the duodenum.                           - No active bleeding seen. Moderate Sedation:      None Recommendation:           - Observe patient in ICU for ongoing care.                           - PPI gtt for 48-72 more hours.                           - Serial CBCs, volume repletion, transfuse PRBCs as                            needed.                           - No OGT/NGT for another couple days at least, if                            at all possible.                           - NPO until further  notice.                           - If rebleeds, consider further doses of                            metoclopramide followed by repeat endoscopy; if no                            rebleeding, would not repeat endoscopy.                           [facility] will follow. Procedure Code(s):        --- Professional ---                           [phone], Esophagogastroduodenoscopy, flexible,                            transoral; diagnostic, including collection of                            specimen(s) by brushing or washing, when performed                            (separate procedure) Diagnosis Code(s):        --- Professional ---                           K20.90, Esophagitis, unspecified without bleeding                           K44.9, Diaphragmatic hernia without obstruction or                            gangrene                           K92.2, Gastrointestinal hemorrhage, unspecified                           K25.4, Chronic or unspecified gastric ulcer with                            hemorrhage                           D62, Acute posthemorrhagic anemia                           K92.0, Hematemesis CPT copyright 2019 [employer]. All rights reserved. The codes  documented in this report are preliminary and upon coder review may  be revised to meet current compliance requirements. Arta Silence, MD 11/04/2019 1:05:08 PM This report has been signed electronically. Number of Addenda: 0

## 2019-11-11 NOTE — Progress Notes (Signed)
0945: Wife Arville Go called to obtain consent for EGD today. Procedure explained to wife. Wife also updated on patient status at this time. All questions answered.

## 2019-11-11 NOTE — Progress Notes (Signed)
[facility] Progress Note Patient Name: Colton Fox DOB: 1946-02-22 MRN: [id number]   Date of Service  11/07/2019  HPI/Events of Note  H/H noted  eICU Interventions  Plan: AM CBC as ordered      Intervention Category Intermediate Interventions: Diagnostic test evaluation  Margaretmary Lombard 10/17/2019, 11:21 PM

## 2019-11-11 NOTE — Progress Notes (Signed)
NAME:  Colton Fox, MRN:  [id number], DOB:  04-08-1946, LOS: 6 ADMISSION DATE:  11/01/2019, CONSULTATION DATE:  11/07/2019 REFERRING MD:  OSH, CHIEF COMPLAINT:  Acute hypoxemic resp failure 2/2 COVID  Brief History   74 y/o male, COVID positive who presented to OSH 1/13 after 4 days of feeling poorly, dx/d with COVID and acute hypoxic resp failure, NSTEMI and AKI on CKD.  He decompensated slowly to warrant ETT placement 1/24. Transferred to [facility] 1/25.   Past Medical History  DM  CKD  HTN HLD CAD s/p PCI PAD GIB Appendectomy Prostate Cancer  Significant Hospital Events   1/13 Admit to OSH with 4 day hx of feeling poorly, COVID + 1/24 Intubated at OSH  1/25 Tx to [facility], prone positioning  1/27 Nephrology consulted 1/28 Bicarb gtt stopped, modestly improved renal function. Hyperglycemia  Consults:    Procedures:  ETT 1/24 >>   Significant Diagnostic Tests:  Renal US 1/26 >> sub-optimal images but negative for hydronephrosis LE Venous Duplex 1/26 >> negative for DVT ECHO 1/26 >> LV with mild concentric hypertrophy, global hypokinesis, LVEF ~45-50%  Micro Data:  BCx2 1/25 >> ng MRSA PCXR 1/25 >> negative  UC 1/25 >> negative U. Strep Antigen 1/26 >> negative    Antimicrobials:  Remdesivir completed  Interim history/subjective:   Critically ill, intubated On 45%/PEEP of 10 Good urine output with Lasix Afebrile  Objective   Blood pressure (!) 101/46, pulse (!) 112, temperature 97.9 F (36.6 C), temperature source Axillary, resp. rate (!) 25, height 6\' 1"  (1.854 m), weight 120.3 kg, SpO2 93 %. CVP:  [2 mmHg] 2 mmHg  Vent Mode: PRVC FiO2 (%):  [40 %-100 %] 60 % Set Rate:  [20 bmp] 20 bmp Vt Set:  [560 mL] 560 mL PEEP:  [10 cmH20] 10 cmH20 Plateau Pressure:  [20 cmH20-26 cmH20] 23 cmH20   Intake/Output Summary (Last 24 hours) at 10/16/2019 1153 Last data filed at 11/06/2019 0800 Gross per 24 hour  Intake 3653.72 ml  Output 1720 ml  Net 1933.72 ml   Filed Weights    11/09/19 0500 11/10/19 0350 11/10/2019 0200  Weight: 121.6 kg 116.6 kg 120.3 kg    Examination: General appearance: 74 y.o., male, intubated on mechanical life support critically ill Eyes: Anicteric, tracking appropriately HENT: NCAT, endotracheal tube in place Neck: Endotracheal tube in place, no JVD Lungs: Bilateral ventilated breath sounds no wheeze CV: RRR, S1, S2 Abdomen: Soft, non-tender; mildly-distended, BS present  Extremities: No peripheral edema, radial and DP pulses present bilaterally  Skin: Normal temperature, turgor and texture; no rash Psych: Dated on mechanical ventilation Neuro: Withdraws to pain   Chest x-ray 10/13/2019: Bilateral infiltrates, possible pneumomediastinum, appropriate positioning of endotracheal tube and central venous line. The patient's images have been independently reviewed by me.    Labs reviewed sodium 154, potassium 3.9 rising serum creatinine 3.6, rising white blood cell count 31,000, hemoglobin 8.3.  INR 1.5.  Resolved Hospital Problem list     Assessment & Plan:   ARDS secondary to bilateral COVID PNA  Continue mechanical ventilation per ARDS protocol Target TVol 6-8cc/kgIBW Target Plateau Pressure < 30cm H20 Target driving pressure less than 15 cm of water Target PaO2 55-65: titrate PEEP/FiO2 per protocol As long as PaO2 to FiO2 ratio is less than 1:150 position in prone position for 16 hours a day, no indication for proning today. Check CVP daily if CVL in place Target CVP less than 4, diurese as necessary Ventilator associated pneumonia prevention protocol  Acute upper GI bleeding, hemorrhagic shock, resolved -Patient responded to hemoglobin transfusion. -EGD today at bedside with ulcer and adherent clot. -No meds or NG tube at this time, no enteral access. -Continue PPI infusion.  Sedation Needs secondary to Mechanical Ventilation  Sedation with PAD protocol, fentanyl plus Precedex  HFrEF Poor images on ECHO but global  hypokinesis  Diuresis to maintain euvolemia With worsening kidney function may need CVVHD  AKI on CKD Acute renal failure Appreciate nephrology input. Continue to follow urine output and BMP Replace electrolytes as needed. Avoid nephrotoxic agents. With worsening kidney function may need to consider CVVHD. No acute indication for dialysis at this time.,  Electrolytes stable, bicarb 29, potassium 3.9  Hypernatremia -Free water replacement  Anemia  Conservative transfusion threshold of hemoglobin less than 7  DM II -uncontrolled hyperglycemia due to steroids A1c 6.9 Levemir plus SSI May need to decrease Levemir after discontinuation of steroids. Discontinue Levemir in the setting of no enteral access for tube feeds.  Hx HTN, HLD, CAD s/p PCI, PAD Holding home blood pressure medications and cardiac meds at this time.  Best practice:  Diet: TF Pain/Anxiety/Delirium protocol (if indicated): protocol VAP protocol (if indicated): yes DVT prophylaxis: sq heparin per protocol GI prophylaxis: famotidine Glucose control: SSI Mobility: bedrest Code Status: Full Code  Family Communication: I called and spoke with the patient's wife via phone Disposition: ICU  This patient is critically ill with multiple organ system failure; which, requires frequent high complexity decision making, assessment, support, evaluation, and titration of therapies. This was completed through the application of advanced monitoring technologies and extensive interpretation of multiple databases. During this encounter critical care time was devoted to patient care services described in this note for 33 minutes.  Garner Nash, DO [facility] 10/23/2019 11:53 AM

## 2019-11-11 NOTE — Progress Notes (Signed)
Pt wife, [name], contacted at [phone]. Informed pt was transferred to [facility] ICU and given contact number. Pt wife informed of events leading to transfer, [name] verbhalized understanding and and had no further questions.

## 2019-11-11 NOTE — H&P (Signed)
[facility] Consultation Note  Referring Provider: PCCM Primary Care Physician:  System, Pcp Not In  Reason for Consultation: hematemesis  HPI: Wiatt Mahabir is a 74 y.o. male admitted few days ago covid pneumonia and renal failure.  Developed acute onset hematemesis and acute blood loss anemia yesterday.  Transferred to [facility] ICU, stabilized, bleeding has stopped.  Case discussed with ICU team (Dr. Valeta Harms) and patient's wife (phone).  Patient intubated/sedated, unable to provide history.  Wife reports husband having GI bleeding many years ago, sounds like he had endoscopy which showed ulcer (no reports or further details available).     Past Medical History:  Diagnosis Date  . CAD (coronary artery disease)    s/p PCI   . CKD (chronic kidney disease)    Suspected CKD III  . Diabetes ([place])   . GIB (gastrointestinal bleeding)   . HLD (hyperlipidemia)   . HTN (hypertension)   . Peripheral artery disease ([place])   . Prostate cancer [facility])     Past Surgical History:  Procedure Laterality Date  . APPENDECTOMY    . CHOLECYSTECTOMY      Prior to Admission medications   Medication Sig Start Date End Date Taking? Authorizing Provider  amLODipine (NORVASC) 10 MG tablet Take 10 mg by mouth daily. 09/19/19  Yes [provider]  aspirin EC 81 MG tablet Take 81 mg by mouth daily.   Yes [provider]  atorvastatin (LIPITOR) 40 MG tablet Take 40 mg by mouth every evening. 07/17/19  Yes [provider]  benazepril (LOTENSIN) 40 MG tablet Take 40 mg by mouth daily. 07/17/19  Yes [provider]  carvedilol (COREG) 25 MG tablet Take 25 mg by mouth 2 (two) times daily. 09/09/19  Yes [provider]  famotidine (PEPCID) 20 MG tablet Take 20 mg by mouth 2 (two) times daily. 08/03/19  Yes [provider]  gemfibrozil (LOPID) 600 MG tablet Take 600 mg by mouth 2 (two) times daily. 09/19/19  Yes [provider]  HUMALOG MIX 75/25 KWIKPEN  (75-25) 100 UNIT/ML Kwikpen Inject 35 Units into the skin 2 (two) times daily.  10/04/19  Yes [provider]  naproxen sodium (ALEVE) 220 MG tablet Take 220-440 mg by mouth 2 (two) times daily as needed (pain.).   Yes [provider]    Current Facility-Administered Medications  Medication Dose Route Frequency Provider Last Rate Last Admin  . 0.9 %  sodium chloride infusion   Intravenous Continuous Arta Silence, MD      . acetaminophen (TYLENOL) 160 MG/5ML solution 650 mg  650 mg Per Tube Q4H PRN Audria Nine, DO      . chlorhexidine gluconate (MEDLINE KIT) (PERIDEX) 0.12 % solution 15 mL  15 mL Mouth Rinse BID Audria Nine, DO   15 mL at 10/25/2019 0800  . Chlorhexidine Gluconate Cloth 2 % PADS 6 each  6 each Topical Daily Audria Nine, DO   6 each at 11/10/19 0520  . dexmedetomidine (PRECEDEX) 400 MCG/100ML (4 mcg/mL) infusion  0.4-1.2 mcg/kg/hr Intravenous Titrated Rigoberto Noel, MD   Stopped at 11/10/19 1808  . docusate (COLACE) 50 MG/5ML liquid 100 mg  100 mg Per Tube BID PRN Audria Nine, DO   100 mg at 11/08/19 0817  . fentaNYL (SUBLIMAZE) bolus via infusion 25 mcg  25 mcg Intravenous Q15 min PRN Audria Nine, DO   25 mcg at 11/10/2019 0353  . fentaNYL 2554mg in NS 2565m(1043mml) infusion-PREMIX  25-200 mcg/hr Intravenous Continuous MarAudria NineO  12.5 mL/hr at 10/27/2019 0921 125 mcg/hr at 11/09/2019 0921  . free water 300 mL  300 mL Per Tube Q4H Rigoberto Noel, MD   300 mL at 11/10/19 1600  . insulin aspart (novoLOG) injection 0-20 Units  0-20 Units Subcutaneous Q4H Ollis, Brandi L, NP   3 Units at 10/17/2019 0810  . insulin aspart (novoLOG) injection 8 Units  8 Units Subcutaneous Q4H Ollis, Brandi L, NP   8 Units at 10/25/2019 0400  . insulin detemir (LEVEMIR) injection 20 Units  20 Units Subcutaneous BID Rigoberto Noel, MD   20 Units at 11/10/19 2205  . MEDLINE mouth rinse  15 mL Mouth Rinse 10 times per day Audria Nine, DO   15 mL at  10/29/2019 0524  . metoCLOPramide (REGLAN) injection 10 mg  10 mg Intravenous Once Arta Silence, MD      . midazolam (VERSED) injection 1 mg  1 mg Intravenous Q15 min PRN Audria Nine, DO   1 mg at 11/01/2019 0425  . midazolam (VERSED) injection 1 mg  1 mg Intravenous Q2H PRN Audria Nine, DO      . norepinephrine (LEVOPHED) '4mg'$  in 274m premix infusion  0-20 mcg/min Intravenous Titrated ARigoberto Noel MD   Stopped at 11/10/19 2239  . pantoprazole (PROTONIX) 80 mg in sodium chloride 0.9 % 250 mL (0.32 mg/mL) infusion  8 mg/hr Intravenous Continuous ARigoberto Noel MD 25 mL/hr at 11/04/2019 0814 8 mg/hr at 10/31/2019 0814  . [START ON 203/03/21 pantoprazole (PROTONIX) injection 40 mg  40 mg Intravenous Q12H AKara MeadV, MD      . sodium chloride flush (NS) 0.9 % injection 10-40 mL  10-40 mL Intracatheter Q12H BCollene Gobble MD   10 mL at 11/10/19 2200  . sodium chloride flush (NS) 0.9 % injection 10-40 mL  10-40 mL Intracatheter PRN BCollene Gobble MD        Allergies as of 10/18/2019  . (No Known Allergies)    No family history on file.  Social History   Socioeconomic History  . Marital status: Married    Spouse name: Not on file  . Number of children: Not on file  . Years of education: Not on file  . Highest education level: Not on file  Occupational History  . Not on file  Tobacco Use  . Smoking status: Never Smoker  . Smokeless tobacco: Never Used  Substance and Sexual Activity  . Alcohol use: Never  . Drug use: Not on file  . Sexual activity: Not on file  Other Topics Concern  . Not on file  Social History Narrative  . Not on file   Social Determinants of Health   Financial Resource Strain:   . Difficulty of Paying Living Expenses: Not on file  Food Insecurity:   . Worried About RCharity fundraiserin the Last Year: Not on file  . Ran Out of Food in the Last Year: Not on file  Transportation Needs:   . Lack of Transportation (Medical): Not on file  .  Lack of Transportation (Non-Medical): Not on file  Physical Activity:   . Days of Exercise per Week: Not on file  . Minutes of Exercise per Session: Not on file  Stress:   . Feeling of Stress : Not on file  Social Connections:   . Frequency of Communication with Friends and Family: Not on file  . Frequency of Social Gatherings with Friends and Family: Not on file  . Attends  Religious Services: Not on file  . Active Member of Clubs or Organizations: Not on file  . Attends Archivist Meetings: Not on file  . Marital Status: Not on file  Intimate Partner Violence:   . Fear of Current or Ex-Partner: Not on file  . Emotionally Abused: Not on file  . Physically Abused: Not on file  . Sexually Abused: Not on file    Review of Systems: Unable to obtain due to intubated  Physical Exam: Vital signs in last 24 hours: Temp:  [97.9 F (36.6 C)-98.4 F (36.9 C)] 97.9 F (36.6 C) (01/31 0401) Pulse Rate:  [31-132] 113 (01/31 1154) Resp:  [0-28] 18 (01/31 1154) BP: (76-110)/(42-65) 96/46 (01/31 1154) SpO2:  [43 %-100 %] 93 % (01/31 1154) Arterial Line BP: (105-177)/(42-56) 114/44 (01/31 0800) FiO2 (%):  [40 %-100 %] 60 % (01/31 1123) Weight:  [120.3 kg] 120.3 kg (01/31 0200) Last BM Date: 11/01/2019 General:   Intubated, sedated Head:  Normocephalic and atraumatic. Eyes:  Sclera clear, no icterus.   Conjunctiva pink. Ears:  Normal auditory acuity. Nose:  No deformity, discharge,  or lesions. Mouth:  No deformity or lesions.  Oropharynx pink & moist. Neck:  Supple; no masses or thyromegaly. Lungs:  Clear throughout to auscultation.   No wheezes, crackles, or rhonchi. No acute distress. Heart:  Tachycardic; no murmurs, clicks, rubs,  or gallops. Abdomen:  Soft, protuberant, nontender and nondistended. No masses, hepatosplenomegaly or hernias noted. Normal bowel sounds, without guarding, and without rebound.     Msk:  Symmetrical without gross deformities. Normal posture. Pulses:   Normal pulses noted. Extremities:  Without clubbing or edema. Neurologic:  Intubated, sedated Skin:  Intact without significant lesions or rashes. Psych:  Alert and cooperative. Normal mood and affect.   Lab Results: Recent Labs    11/10/19 1836 10/22/2019 0030 11/04/2019 0555  WBC 21.2* 27.5* 31.7*  HGB 4.6* 7.6* 8.3*  HCT 16.9* 24.1* 25.8*  PLT 125* 160 153   BMET Recent Labs    11/09/19 0420 11/10/19 0554 10/23/2019 0555  NA 146* 151* 154*  K 3.2* 3.8 3.9  CL 102 108 110  CO2 34* 33* 29  GLUCOSE 357* 258* 167*  BUN 143* 157* 196*  CREATININE 3.18* 3.06* 3.60*  CALCIUM 7.9* 8.2* 7.8*   LFT Recent Labs    11/10/19 0554  PROT 5.4*  ALBUMIN 2.1*  AST 24  ALT 17  ALKPHOS 76  BILITOT 0.3   PT/INR Recent Labs    11/09/2019 0030  LABPROT 17.9*  INR 1.5*    Studies/Results: DG Chest Port 1 View  Result Date: 11/01/2019 CLINICAL DATA:  Acute respiratory failure EXAM: PORTABLE CHEST 1 VIEW COMPARISON:  Chest radiograph 11/10/2019 FINDINGS: ET tube terminates in the mid trachea. Right IJ central venous catheter tip projects over the superior vena cava. Enteric tube courses inferior to the diaphragm. Monitoring leads overlie the patient. Stable cardiac and mediastinal contours. Lucency projects over the superior mediastinum. Interval worsening right mid and lower lung and left lower lung airspace opacities. Probable small left pleural effusion. IMPRESSION: Interval worsening right mid and lower lung and left lung base airspace opacities. Lucency projects over the superior mediastinum, poorly evaluated due to positioning. The possibility of pneumomediastinum is not excluded. Recommend repeat chest radiograph with decreased rotation for further evaluation. Electronically Signed   By: Lovey Newcomer M.D.   On: 10/17/2019 08:04    Impression:  1.  Hematemesis 2.  Anemia. 3.  COVID pneumonia.  Plan:  1.  Supportive care with IVF, transfusion as needed, PPI 2.  Endoscopy  today.  Discussed with wife. 3.  Risks (bleeding, infection, bowel perforation that could require surgery, sedation-related changes in cardiopulmonary systems), benefits (identification and possible treatment of source of symptoms, exclusion of certain causes of symptoms), and alternatives (watchful waiting, radiographic imaging studies, empiric medical treatment) of upper endoscopy (EGD) were explained to wife in detail and patient wishes to proceed. 4.  Next step in management pending endoscopy findings.   LOS: 6 days   Acelyn Basham M  10/24/2019, 12:03 PM  Cell [phone] If no answer or after 5 PM call [phone]

## 2019-11-11 NOTE — Plan of Care (Signed)
Seen at bedside after transfer from [facility].    Not on pressors, continues to be on ppi drip.  Unresponsive, on fentanyl drip.  Satting 100% on high vent settings.    Will continue ppi drip Frequent CBCs E link is touching base with GI Wean vent as tolerated.

## 2019-11-11 NOTE — Brief Op Note (Signed)
Endoscopy showed:   1.  Significant esophagitis of mid-to-distal stomach. 2.  Large blood clot in fundus/body, obscuring many gastric views. 3.  Large punctate 78mm ulcer in proximal stomach along incisura/lesser curvature.  There was large adherent clot versus large vessel.  Risks of triggering massive rebleeding with endoscopic therapy felt to outweigh benefits of such attempts at therapy.  No active bleeding noted. 4.  Normal proximal duodenum without bleeding lesion identified.  Plan:  1.  PPI gtt for next 2-3 days. 2.  No OGT/NGT for the next couple days, if at all possible. 3.  Serial CBCs, volume repletion, transfuse PRBCs as needed. 4.  If rebleeds, would consider repeat endoscopy, especially given limited views from large proximal gastric clot.  If no rebleeding, would not repeat endoscopy. 5.  Full report later today. 6.  Results discussed with ICU nurse (in person) as well as patient's wife Mechele Claude, telephone). 7.  [facility] will follow.

## 2019-11-11 NOTE — Progress Notes (Signed)
eLink Physician-Brief Progress Note Patient Name: Colton Fox DOB: 01/25/1946 MRN: [id number]   Date of Service  10/14/2019  HPI/Events of Note  73/M with acute respiratory failure secondary to COVID-19, intubated on 11/04/19, who had an UGIB noted at [facility].  He had an acute drop in H&H from  7.1/24.3 --> 4.6/16.9 with fresh blood per NGT.  Pt has been transfused 2 units pRBC, 1 FFP and 1 platelet. He has been started on protonix gtt.  He is now off levophed. Hgb has improved to 7.6/24.1.  On video assessment, pt is intubated.  BP 106/49, HR 116, RR 26, O2 sats 100%.  eICU Interventions  Repeat ABG.  Pt completed 10 days of dexamethasone, additional 5 days of solu-medrol and remdesivir.   Insulin for glucose control.  Consult called for Dr. Tarri Glenn and team to see the patient in the AM.  Continue protonix gtt.  SCDs for DVT prophylaxis.      Intervention Category Evaluation Type: New Patient Evaluation  Elsie Lincoln 10/29/2019, 2:30 AM

## 2019-11-11 NOTE — Progress Notes (Signed)
Report called to Bryant, RN at [facility] MICU. Pt transport arrived, pt transported on 115mcg/hr fentanyl, HR 112, BP 118/52, O2sats 98%. Pt left floor approx 0125.

## 2019-11-12 ENCOUNTER — Inpatient Hospital Stay (HOSPITAL_COMMUNITY): Payer: Medicare Other

## 2019-11-12 LAB — GLUCOSE, CAPILLARY
Glucose-Capillary: 177 mg/dL — ABNORMAL HIGH (ref 70–99)
Glucose-Capillary: 196 mg/dL — ABNORMAL HIGH (ref 70–99)
Glucose-Capillary: 173 mg/dL — ABNORMAL HIGH (ref 70–99)
Glucose-Capillary: 92 mg/dL (ref 70–99)
Glucose-Capillary: 157 mg/dL — ABNORMAL HIGH (ref 70–99)
Glucose-Capillary: 54 mg/dL — ABNORMAL LOW (ref 70–99)
Glucose-Capillary: 113 mg/dL — ABNORMAL HIGH (ref 70–99)

## 2019-11-12 LAB — CBC WITH DIFFERENTIAL/PLATELET
Abs Immature Granulocytes: 0.29 10*3/uL — ABNORMAL HIGH (ref 0.00–0.07)
Basophils Absolute: 0 10*3/uL (ref 0.0–0.1)
Basophils Relative: 0 %
Eosinophils Absolute: 0.2 10*3/uL (ref 0.0–0.5)
Eosinophils Relative: 1 %
HCT: 24.4 % — ABNORMAL LOW (ref 39.0–52.0)
Hemoglobin: 7.6 g/dL — ABNORMAL LOW (ref 13.0–17.0)
Immature Granulocytes: 1 %
Lymphocytes Relative: 4 %
Lymphs Abs: 0.8 10*3/uL (ref 0.7–4.0)
MCH: 30.3 pg (ref 26.0–34.0)
MCHC: 31.1 g/dL (ref 30.0–36.0)
MCV: 97.2 fL (ref 80.0–100.0)
Monocytes Absolute: 1.4 10*3/uL — ABNORMAL HIGH (ref 0.1–1.0)
Monocytes Relative: 6 %
Neutro Abs: 20.3 10*3/uL — ABNORMAL HIGH (ref 1.7–7.7)
Neutrophils Relative %: 88 %
Platelets: 120 10*3/uL — ABNORMAL LOW (ref 150–400)
RBC: 2.51 MIL/uL — ABNORMAL LOW (ref 4.22–5.81)
RDW: 18.5 % — ABNORMAL HIGH (ref 11.5–15.5)
WBC: 23.1 10*3/uL — ABNORMAL HIGH (ref 4.0–10.5)
nRBC: 3.1 % — ABNORMAL HIGH (ref 0.0–0.2)

## 2019-11-12 LAB — TYPE AND SCREEN
ABO/RH(D): A POS
Antibody Screen: NEGATIVE
Unit division: 0
Unit division: 0

## 2019-11-12 LAB — POCT I-STAT 7, (LYTES, BLD GAS, ICA,H+H)
Acid-Base Excess: 3 mmol/L — ABNORMAL HIGH (ref 0.0–2.0)
Acid-Base Excess: 4 mmol/L — ABNORMAL HIGH (ref 0.0–2.0)
Bicarbonate: 27.2 mmol/L (ref 20.0–28.0)
Bicarbonate: 29.5 mmol/L — ABNORMAL HIGH (ref 20.0–28.0)
Calcium, Ion: 1.07 mmol/L — ABNORMAL LOW (ref 1.15–1.40)
Calcium, Ion: 1.11 mmol/L — ABNORMAL LOW (ref 1.15–1.40)
HCT: 23 % — ABNORMAL LOW (ref 39.0–52.0)
HCT: 27 % — ABNORMAL LOW (ref 39.0–52.0)
Hemoglobin: 7.8 g/dL — ABNORMAL LOW (ref 13.0–17.0)
Hemoglobin: 9.2 g/dL — ABNORMAL LOW (ref 13.0–17.0)
O2 Saturation: 85 %
O2 Saturation: 90 %
Patient temperature: 97.8
Patient temperature: 98.7
Potassium: 3.2 mmol/L — ABNORMAL LOW (ref 3.5–5.1)
Potassium: 3.6 mmol/L (ref 3.5–5.1)
Sodium: 153 mmol/L — ABNORMAL HIGH (ref 135–145)
Sodium: 152 mmol/L — ABNORMAL HIGH (ref 135–145)
TCO2: 28 mmol/L (ref 22–32)
TCO2: 31 mmol/L (ref 22–32)
pCO2 arterial: 40.8 mmHg (ref 32.0–48.0)
pCO2 arterial: 46 mmHg (ref 32.0–48.0)
pH, Arterial: 7.431 (ref 7.350–7.450)
pH, Arterial: 7.415 (ref 7.350–7.450)
pO2, Arterial: 47 mmHg — ABNORMAL LOW (ref 83.0–108.0)
pO2, Arterial: 59 mmHg — ABNORMAL LOW (ref 83.0–108.0)

## 2019-11-12 LAB — BASIC METABOLIC PANEL
Anion gap: 16 — ABNORMAL HIGH (ref 5–15)
Anion gap: 13 (ref 5–15)
BUN: 203 mg/dL — ABNORMAL HIGH (ref 8–23)
BUN: 181 mg/dL — ABNORMAL HIGH (ref 8–23)
CO2: 27 mmol/L (ref 22–32)
CO2: 27 mmol/L (ref 22–32)
Calcium: 7.7 mg/dL — ABNORMAL LOW (ref 8.9–10.3)
Calcium: 7.8 mg/dL — ABNORMAL LOW (ref 8.9–10.3)
Chloride: 111 mmol/L (ref 98–111)
Chloride: 113 mmol/L — ABNORMAL HIGH (ref 98–111)
Creatinine, Ser: 3.96 mg/dL — ABNORMAL HIGH (ref 0.61–1.24)
Creatinine, Ser: 3.19 mg/dL — ABNORMAL HIGH (ref 0.61–1.24)
GFR calc Af Amer: 16 mL/min — ABNORMAL LOW (ref >60)
GFR calc Af Amer: 21 mL/min — ABNORMAL LOW (ref >60)
GFR calc non Af Amer: 14 mL/min — ABNORMAL LOW (ref >60)
GFR calc non Af Amer: 18 mL/min — ABNORMAL LOW (ref >60)
Glucose, Bld: 193 mg/dL — ABNORMAL HIGH (ref 70–99)
Glucose, Bld: 121 mg/dL — ABNORMAL HIGH (ref 70–99)
Potassium: 3.7 mmol/L (ref 3.5–5.1)
Potassium: 3.5 mmol/L (ref 3.5–5.1)
Sodium: 154 mmol/L — ABNORMAL HIGH (ref 135–145)
Sodium: 153 mmol/L — ABNORMAL HIGH (ref 135–145)

## 2019-11-12 LAB — BPAM RBC
Blood Product Expiration Date: 202102122359
Blood Product Expiration Date: 202102142359
ISSUE DATE / TIME: 202101302127
ISSUE DATE / TIME: 202101302127
Unit Type and Rh: 6200
Unit Type and Rh: 6200

## 2019-11-12 LAB — CBC
HCT: 29.6 % — ABNORMAL LOW (ref 39.0–52.0)
Hemoglobin: 9.2 g/dL — ABNORMAL LOW (ref 13.0–17.0)
MCH: 30.4 pg (ref 26.0–34.0)
MCHC: 31.1 g/dL (ref 30.0–36.0)
MCV: 97.7 fL (ref 80.0–100.0)
RBC: 3.03 MIL/uL — ABNORMAL LOW (ref 4.22–5.81)
RDW: 18.5 % — ABNORMAL HIGH (ref 11.5–15.5)
WBC: 14.5 10*3/uL — ABNORMAL HIGH (ref 4.0–10.5)
nRBC: 4.2 % — ABNORMAL HIGH (ref 0.0–0.2)

## 2019-11-12 LAB — PREPARE FRESH FROZEN PLASMA: Unit division: 0

## 2019-11-12 LAB — BPAM FFP
Blood Product Expiration Date: 202102042359
ISSUE DATE / TIME: 202101302151
Unit Type and Rh: 600

## 2019-11-12 LAB — MAGNESIUM: Magnesium: 3 mg/dL — ABNORMAL HIGH (ref 1.7–2.4)

## 2019-11-12 LAB — BPAM PLATELET PHERESIS
Blood Product Expiration Date: 202101312132
ISSUE DATE / TIME: 202101302140
Unit Type and Rh: 6200

## 2019-11-12 LAB — PREPARE PLATELET PHERESIS: Unit division: 0

## 2019-11-12 LAB — PREPARE RBC (CROSSMATCH)

## 2019-11-12 MED ORDER — DEXTROSE 50 % IV SOLN: 50.0000 mL | Freq: Once | INTRAVENOUS | Status: AC

## 2019-11-12 MED ORDER — VECURONIUM BOLUS VIA INFUSION
10.0000 mg | Freq: Once | INTRAVENOUS | Status: AC
  Administered 2019-11-13: 10 mg via INTRAVENOUS
  Filled 2019-11-12: qty 10

## 2019-11-12 MED ORDER — VECURONIUM BROMIDE 10 MG IV SOLR
0.0000 ug/kg/min | INTRAVENOUS | Status: DC
  Administered 2019-11-13: 02:00:00 1 ug/kg/min via INTRAVENOUS
  Filled 2019-11-12: qty 100

## 2019-11-12 MED ORDER — FENTANYL CITRATE (PF) 100 MCG/2ML IJ SOLN
25.0000 ug | Freq: Once | INTRAMUSCULAR | Status: AC
  Administered 2019-11-12: 25 ug via INTRAVENOUS

## 2019-11-12 MED ORDER — METOPROLOL TARTRATE 5 MG/5ML IV SOLN
2.5000 mg | Freq: Once | INTRAVENOUS | Status: AC
  Administered 2019-11-12: 05:00:00 2.5 mg via INTRAVENOUS
  Filled 2019-11-12: qty 5

## 2019-11-12 MED ORDER — PRISMASOL BGK 4/2.5 32-4-2.5 MEQ/L REPLACEMENT SOLN
Status: DC
  Filled 2019-11-12 (×4): qty 5000

## 2019-11-12 MED ORDER — HEPARIN SODIUM (PORCINE) 1000 UNIT/ML DIALYSIS
1000.0000 [IU] | INTRAMUSCULAR | Status: DC | PRN
  Administered 2019-11-12: 19:00:00 2800 [IU] via INTRAVENOUS_CENTRAL
  Filled 2019-11-12 (×2): qty 6

## 2019-11-12 MED ORDER — FENTANYL BOLUS VIA INFUSION
25.0000 ug | INTRAVENOUS | Status: DC | PRN
  Filled 2019-11-12: qty 25

## 2019-11-12 MED ORDER — ARTIFICIAL TEARS OPHTHALMIC OINT
1.0000 "application " | TOPICAL_OINTMENT | Freq: Three times a day (TID) | OPHTHALMIC | Status: DC
  Administered 2019-11-12 – 2019-11-13 (×2): 1 via OPHTHALMIC
  Filled 2019-11-12: qty 3.5

## 2019-11-12 MED ORDER — PRISMASOL BGK 4/2.5 32-4-2.5 MEQ/L REPLACEMENT SOLN
Status: DC
  Filled 2019-11-12 (×7): qty 5000

## 2019-11-12 MED ORDER — HEPARIN SODIUM (PORCINE) 1000 UNIT/ML DIALYSIS
1000.0000 [IU] | INTRAMUSCULAR | Status: DC | PRN
  Filled 2019-11-12: qty 6
  Filled 2019-11-12: qty 4

## 2019-11-12 MED ORDER — MIDAZOLAM BOLUS VIA INFUSION
1.0000 mg | INTRAVENOUS | Status: DC | PRN
  Filled 2019-11-12: qty 2

## 2019-11-12 MED ORDER — MIDAZOLAM 50MG/50ML (1MG/ML) PREMIX INFUSION
2.0000 mg/h | INTRAVENOUS | Status: DC
  Administered 2019-11-12 – 2019-11-13 (×2): 5 mg/h via INTRAVENOUS
  Administered 2019-11-13: 10 mg/h via INTRAVENOUS
  Administered 2019-11-14: 08:00:00 2 mg/h via INTRAVENOUS
  Filled 2019-11-12 (×4): qty 50

## 2019-11-12 MED ORDER — SODIUM CHLORIDE 0.9% IV SOLUTION
Freq: Once | INTRAVENOUS | Status: AC
  Administered 2019-11-12: 12:00:00 10 mL/h via INTRAVENOUS

## 2019-11-12 MED ORDER — FENTANYL 2500MCG IN NS 250ML (10MCG/ML) PREMIX INFUSION: 25.0000 ug/h | INTRAVENOUS | Status: DC

## 2019-11-12 MED ORDER — DEXTROSE 50 % IV SOLN
INTRAVENOUS | Status: AC
  Administered 2019-11-12: 20:00:00 50 mL via INTRAVENOUS
  Filled 2019-11-12: qty 50

## 2019-11-12 MED ORDER — PRISMASOL BGK 4/2.5 32-4-2.5 MEQ/L IV SOLN
INTRAVENOUS | Status: DC
  Filled 2019-11-12 (×26): qty 5000

## 2019-11-12 NOTE — Progress Notes (Signed)
ABG obtained on PRVC 80%/12+/20/480.

## 2019-11-12 NOTE — Progress Notes (Signed)
[place] Progress Note Patient Name: Atzel Mccambridge DOB: Jan 25, 1946 MRN: [id number]   Date of Service  11/12/2019  HPI/Events of Note  Ventilator asynchrony   And hypoxia. ABG on 40%/PRVC 20/TV 480/P 10 = 7.431/40.8/47.0.   eICU Interventions  Will order: 1. Increase ceiling on Fentanyl IV infusion to 400 mcg/hour. 2. Increase PEEP to 12.      Intervention Category Major Interventions: Respiratory failure - evaluation and management;Hypoxemia - evaluation and management  Lysle Dingwall 11/12/2019, 10:01 PM

## 2019-11-12 NOTE — Progress Notes (Signed)
Spoke with patients wife and grandson who is an RT, and updated them on patients condition. All questions answered. Consents for blood and vas cath placement verified over the phone with wife during the day.

## 2019-11-12 NOTE — Progress Notes (Signed)
[place] Progress Note Patient Name: Colton Fox DOB: 15-Sep-1946 MRN: [id number]   Date of Service  11/12/2019  HPI/Events of Note  Notified of A fib with rates in 150s? When I saw the patient, he is in SR at 105, stable BP  eICU Interventions  Asked RN to send CBC BMP and mag now      Intervention Category Major Interventions: Arrhythmia - evaluation and management  Bjorn Hallas G Selyna Klahn 11/12/2019, 1:17 AM

## 2019-11-12 NOTE — Progress Notes (Signed)
Kamat MD called several times for a fib HR 120's. One time 2.5mg  metoprolol given not making much difference. MD paged again and no new orders. Will continue to monitor patient closely.

## 2019-11-12 NOTE — Progress Notes (Signed)
NAME:  Colton Fox, MRN:  [id number], DOB:  October 14, 1945, LOS: 7 ADMISSION DATE:  10/23/2019, CONSULTATION DATE:  10/13/2019 REFERRING MD:  OSH, CHIEF COMPLAINT:  Acute hypoxemic resp failure 2/2 COVID  Brief History   74 y/o male, COVID positive who presented to OSH 1/13 after 4 days of feeling poorly, dx/d with COVID and acute hypoxic resp failure, NSTEMI and AKI on CKD.  He decompensated slowly to warrant ETT placement 1/24. Transferred to [facility] 1/25.   Past Medical History  DM  CKD  HTN HLD CAD s/p PCI PAD GIB Appendectomy Prostate Cancer  Significant Hospital Events   1/13 Admit to OSH with 4 day hx of feeling poorly, COVID + 1/24 Intubated at OSH  1/25 Tx to [facility], prone positioning  1/27 Nephrology consulted 1/28 Bicarb gtt stopped, modestly improved renal function. Hyperglycemia  Consults:    Procedures:  ETT 1/24 >>   Significant Diagnostic Tests:  Renal US 1/26 >> sub-optimal images but negative for hydronephrosis LE Venous Duplex 1/26 >> negative for DVT ECHO 1/26 >> LV with mild concentric hypertrophy, global hypokinesis, LVEF ~45-50%  Micro Data:  BCx2 1/25 >> ng MRSA PCXR 1/25 >> negative  UC 1/25 >> negative U. Strep Antigen 1/26 >> negative    Antimicrobials:  Remdesivir completed  Interim history/subjective:   Patient remains critically ill intubated on mechanical life support.  Reviewed results of EGD.  Spoke with wife via phone.  Objective   Blood pressure (!) 127/46, pulse (!) 110, temperature 97.7 F (36.5 C), temperature source Oral, resp. rate 20, height 6\' 1"  (1.854 m), weight 123.9 kg, SpO2 90 %. CVP:  [3 mmHg-6 mmHg] 6 mmHg  Vent Mode: PRVC FiO2 (%):  [40 %-60 %] 40 % Set Rate:  [20 bmp] 20 bmp Vt Set:  [480 mL-560 mL] 480 mL PEEP:  [10 cmH20] 10 cmH20 Plateau Pressure:  [17 cmH20-24 cmH20] 18 cmH20   Intake/Output Summary (Last 24 hours) at 11/12/2019 1139 Last data filed at 11/12/2019 1000 Gross per 24 hour  Intake 1636.9 ml  Output  1570 ml  Net 66.9 ml   Filed Weights   11/10/19 0350 11/03/2019 0200 11/12/19 0406  Weight: 116.6 kg 120.3 kg 123.9 kg    Examination: General appearance: 74 y.o., male Eyes: anicteric sclerae HENT: NCAT, mucous membranes moist endotracheal tube in place Neck: Trachea midline Lungs: Bilateral ventilated breath sounds, no crackles, no wheeze CV: RRR, S1, S2, no MRGs  Abdomen: Soft, non-tender; non-distended, BS present  Extremities: No peripheral edema, radial and DP pulses present bilaterally  Skin: Normal temperature, turgor and texture; no rash Psych: Appropriate affect Neuro: Alert and oriented to person and place, no focal deficit    Chest x-ray 10/13/2019: Bilateral infiltrates, possible pneumomediastinum, appropriate positioning of endotracheal tube and central venous line. The patient's images have been independently reviewed by me.    Labs reviewed: Sodium 154, potassium 3.7, serum creatinine 3.96, increasing, will white blood cell count 23, stable  Resolved Hospital Problem list     Assessment & Plan:   ARDS secondary to bilateral COVID PNA  Possible pneumomediastinum Continue mechanical ventilation per ARDS protocol Target TVol 6-8cc/kgIBW Target Plateau Pressure < 30cm H20 Target driving pressure less than 15 cm of water Target PaO2 55-65: titrate PEEP/FiO2 per protocol No indication for proning at this time Ventilator associated pneumonia prevention protocol Repeat chest x-ray today.  Acute upper GI bleeding, hemorrhagic shock, resolved -Continue to observe H&H -EGD completed 10/12/2019 -Gastric bleeding ulcer.Adherent clot was present. -Continue  on Protonix infusion -Additional unit of blood today  Sedation Needs secondary to Mechanical Ventilation  PAD sedation propofol, fentanyl plus Precedex  HFrEF With worsening kidney function and increased volume overload may need CVVHD for volume removal.  However at this point appears stable.  AKI on CKD Acute  renal failure Appreciate nephrology input. Continue to follow urine output and BMP Has made 1300 cc of urine yesterday. Kidney function continues to worsen. Discussed need for CVVHD with wife.  They are agreeable to proceed. No acute indication at this time but I suspect will be needed within the next 24 to 48 hours.  Hypernatremia -Continue free water replacement  Anemia  Conservative transfusion for hemoglobin less than 8 due to history of CAD  DM II -uncontrolled hyperglycemia due to steroids SSI alone Holding Levemir as patient currently not receiving enteral access.  Hx HTN, HLD, CAD s/p PCI, PAD Olding home BP medications and cardiac medications  Best practice:  Diet: Holding due to no enteral access Pain/Anxiety/Delirium protocol (if indicated): protocol VAP protocol (if indicated): yes DVT prophylaxis: Holding GI prophylaxis: Protonix infusion Glucose control: SSI Mobility: bedrest Code Status: Full Code  Family Communication: I called and spoke with the patient's wife via phone Disposition: ICU  This patient is critically ill with multiple organ system failure; which, requires frequent high complexity decision making, assessment, support, evaluation, and titration of therapies. This was completed through the application of advanced monitoring technologies and extensive interpretation of multiple databases. During this encounter critical care time was devoted to patient care services described in this note for 34 minutes.  Garner Nash, DO [facility] 11/12/2019 11:47 AM

## 2019-11-12 NOTE — Progress Notes (Signed)
Hypoglycemic Event  CBG: 54  Treatment: D50 50 mL (25 gm)  Symptoms: none  Follow-up CBG: Time:2000 CBG Result:157  Possible Reasons for Event: Inadequate meal intake  Comments/MD notified:elink see new orders     Vivia Ewing

## 2019-11-12 NOTE — Progress Notes (Signed)
[facility] Progress Note  Colton Fox 74 y.o. Dec 27, 1945   Subjective: Black stools from rectal tube this morning.   Objective: Vital signs: Vitals:   11/12/19 1214 11/12/19 1237  BP:    Pulse: 99 98  Resp: (!) 21 (!) 21  Temp: 98.1 F (36.7 C) 98.1 F (36.7 C)  SpO2: (!) 89% 92%  BP 127/46  Physical Exam: Intubated; Not examined due to COVID positivity  Lab Results: Recent Labs    10/25/2019 0555 11/12/19 0121  NA 154* 154*  K 3.9 3.7  CL 110 111  CO2 29 27  GLUCOSE 167* 193*  BUN 196* 203*  CREATININE 3.60* 3.96*  CALCIUM 7.8* 7.7*  MG  --  3.0*   Recent Labs    11/10/19 0554  AST 24  ALT 17  ALKPHOS 76  BILITOT 0.3  PROT 5.4*  ALBUMIN 2.1*   Recent Labs    11/10/19 0554 11/10/19 1836 10/14/2019 2233 11/12/19 0121  WBC 21.2*   < > 24.2* 23.1*  NEUTROABS 19.6*  --   --  20.3*  HGB 7.1*   < > 7.8* 7.6*  HCT 24.3*   < > 24.8* 24.4*  MCV 100.8*   < > 95.8 97.2  PLT 172   < > 129* 120*   < > = values in this interval not displayed.      Assessment/Plan: Upper GI bleed likely from large cratered gastric ulcer seen on EGD yesterday. Continue Protonix drip. Hgb stable at 7.6. No plans to repeat EGD at this time. Supportive care.    Colton Fox 11/12/2019, 2:10 PM  Questions please call [phone] ID: Colton Fox, male   DOB: 1946/08/31, 74 y.o.   MRN: [id number]

## 2019-11-12 NOTE — Progress Notes (Signed)
[place] Progress Note Patient Name: Colton Fox DOB: 10-27-1945 MRN: [id number]   Date of Service  11/12/2019  HPI/Events of Note  Hypoflyxwmi - Blood glucose = 54.  Patient is on a Q 4 hour resistant Novolog SSI + 8 units Novolog tube feeding coverage.  eICU Interventions  Will D/C extra 8 units Novolog tube feeding coverage.      Intervention Category Major Interventions: Other:  Lysle Dingwall 11/12/2019, 8:15 PM

## 2019-11-12 NOTE — Progress Notes (Signed)
[place] Progress Note Patient Name: Colton Fox DOB: 11/10/1945 MRN: [id number]   Date of Service  11/12/2019  HPI/Events of Note  A fib - HR 10-130, sustaining over 120s now for 15 min per RN . SBP is 118-120  eICU Interventions  Try IV metoprolol x 1     Intervention Category Major Interventions: Arrhythmia - evaluation and management  Margaretmary Lombard 11/12/2019, 4:46 AM

## 2019-11-12 NOTE — Procedures (Signed)
Hemodialysis Catheter Insertion Procedure Note Zein Helbing [id number] 01-23-1946  Procedure: Insertion of Hemodialysis Catheter Indications: Dialysis Access   Procedure Details Consent: Risks of procedure as well as the alternatives and risks of each were explained to the (patient/caregiver).  Consent for procedure obtained. Time Out: Verified patient identification, verified procedure, site/side was marked, verified correct patient position, special equipment/implants available, medications/allergies/relevent history reviewed, required imaging and test results available.  Performed  Maximum sterile technique was used including antiseptics, cap, gloves, gown, hand hygiene, mask and sheet. Skin prep: Chlorhexidine; local anesthetic administered Triple lumen hemodialysis catheter was inserted into left internal jugular vein using the Seldinger technique.  Evaluation Blood flow good Complications: No apparent complications Patient did tolerate procedure well. Chest X-ray ordered to verify placement.  CXR: pending.   JUWAUN INSKEEP 11/12/2019

## 2019-11-12 NOTE — Progress Notes (Signed)
Nutrition Follow-up  DOCUMENTATION CODES:   Obesity unspecified  INTERVENTION:   Resume Tube Feeding once enteral access able to be obtained   Tube Feeding Recommendations:  Vital 1.5 at 55 ml/hr (1320 mL/day) Pro-Stat 60 mL TID (6 packets) Provides 2580 kcals, 179 g of protein and 1003 mL  Pt will need additional free water flushes to meet hydration needs and to correct hypernatremia it still persists when enteral access obtained  NUTRITION DIAGNOSIS:   Increased nutrient needs related to acute illness(COVID-19) as evidenced by estimated needs.  Continues  GOAL:   Patient will meet greater than or equal to 90% of their needs  Not met  MONITOR:   TF tolerance, Labs  REASON FOR ASSESSMENT:   Consult, Ventilator Enteral/tube feeding initiation and management  ASSESSMENT:   Pt with PMH of DM, CKD, HTN, HLD, CAD, PAD, GIB who presented to OSH 1/13 for hypoxia dx with COVID-19. Required intubation 1/24 and tx to [place] 1/25.  1/13 Admitted to outside hospital with [place] 1/24 Intubated 1/25 Tx to [facility] 1/30 Transfer to [facility]; Hematemesis and acute blood loss anemia 1/31 EGD: large cratered gastric ulcer  Noted possibility of CRRT in next 24-48 hours  No prone positioning at this time Patient is currently intubated on ventilator support MV: 12.8 L/min Temp (24hrs), Avg:97.6 F (36.4 C), Min:96.7 F (35.9 C), Max:98.1 F (36.7 C)  OG removed during EGD yesterday TF on hold due to no access; per GI notes, GI recommending that OG/NG not be replaced for several days  Black stool via rectal tube  Current weight 123.9 kg; admit weight to [facility] 122.5 kg. Unsure of admit weight to outside hospital.  Lowest weight 116.6 kg.   Hypernatremic; noted order for free water flushes of 300 mL q 4 hours however no enteral access at present time. Pt with order for d10 at 50 ml/hr  Labs: sodium 154 (H), BUN 203 (H), Creatinine 3.96  Meds: D10 at 50 ml/hr, ss novolog, novolog q  4 hours  Diet Order:   Diet Order            Diet NPO time specified  Diet effective now              EDUCATION NEEDS:   No education needs have been identified at this time  Skin:  Skin Assessment: Reviewed RN Assessment  Last BM:  2/1 rectal tube  Height:   Ht Readings from Last 1 Encounters:  10/27/2019 _0  (1.854 m)    Weight:   Wt Readings from Last 1 Encounters:  11/12/19 123.9 kg    Ideal Body Weight:  83.6 kg  BMI:  Body mass index is 36.04 kg/m.  Estimated Nutritional Needs:   Kcal:  3976-7341  Protein:  167-209 grams  Fluid:  2 L/day   Kerman Passey MS, RDN, LDN, CNSC [phone] Pager  [phone] Weekend/On-Call Pager

## 2019-11-12 NOTE — Progress Notes (Signed)
[facility] Progress Note    Assessment/ Plan:   1. AKI / renal failure - may have underlying CKD. Creat at outside hospital was 1.9- 2.7. Renal US w/ nl kidneys. Urine lytes showed low FeNa. May be cardiorenal component.  BUN and CR rising, he will need CRRT in the next 24 hrs-- d/w PCCM, will place line, start CRRT.  Azotemia from GIB and steroids.      2. BP/volume - as above 3. COVID +PNA - on vent, sp remdesivir/ decadron at outside hospital prior to tx 4. H/o HTN - BP's on higher side, home meds are on hold; avoid acei/ arb 5. DM on insulin  6. H/o CAD sp stent 7. H/o prostate cancer 8. UGIB: s/p EGD with cratered gastric ulcers GI following.   9. Dispo: critically ill  Subjective:    Seen in room.  Remains intubated, sedated,  BUN > 200   Objective:   BP 119/71   Pulse 100   Temp 97.9 F (36.6 C) (Oral)   Resp 18   Ht '6\' 1"'$  (1.854 m)   Wt 123.9 kg   SpO2 94%   BMI 36.04 kg/m   Intake/Output Summary (Last 24 hours) at 11/12/2019 1734 Last data filed at 11/12/2019 1600 Gross per 24 hour  Intake 2547.81 ml  Output 1895 ml  Net 652.81 ml   Weight change: 3.6 kg  Physical Exam: Gen: intubated, unresponsive CVS:RRR no rub Resp: coarse bilateral breath sounds Abd: soft Ext:2+ anasarca  Imaging: DG CHEST PORT 1 VIEW  Result Date: 11/12/2019 CLINICAL DATA:  Intubation, COVID EXAM: PORTABLE CHEST 1 VIEW COMPARISON:  10/28/2019 FINDINGS: Slight interval worsening of extensive bilateral heterogeneous and interstitial airspace opacity, most conspicuous in the right midlung and left lung base. Interval removal of esophagogastric tube. Endotracheal tube and right neck vascular catheter remain in position. Subtle air lucency about the mediastinal margins and left heart border again seen, suspicious for pneumomediastinum. No pneumothorax. Mild cardiomegaly. IMPRESSION: 1. Slight interval worsening of extensive bilateral heterogeneous and interstitial airspace  opacity, most conspicuous in the right midlung and left lung base, consistent with slightly worsened COVID pneumonia. 2. Interval removal of esophagogastric tube. Other support apparatus remains unchanged. 3. Subtle air lucency about the mediastinal margins and left heart border again seen, suspicious for pneumomediastinum in the setting of ventilation. No pneumothorax. Electronically Signed   By: Eddie Candle M.D.   On: 11/12/2019 12:32   DG Chest Port 1 View  Result Date: 11/09/2019 CLINICAL DATA:  Acute respiratory failure EXAM: PORTABLE CHEST 1 VIEW COMPARISON:  Chest radiograph 11/10/2019 FINDINGS: ET tube terminates in the mid trachea. Right IJ central venous catheter tip projects over the superior vena cava. Enteric tube courses inferior to the diaphragm. Monitoring leads overlie the patient. Stable cardiac and mediastinal contours. Lucency projects over the superior mediastinum. Interval worsening right mid and lower lung and left lower lung airspace opacities. Probable small left pleural effusion. IMPRESSION: Interval worsening right mid and lower lung and left lung base airspace opacities. Lucency projects over the superior mediastinum, poorly evaluated due to positioning. The possibility of pneumomediastinum is not excluded. Recommend repeat chest radiograph with decreased rotation for further evaluation. Electronically Signed   By: Lovey Newcomer M.D.   On: 10/12/2019 08:04    Labs: BMET Recent Labs  Lab  0000 11/06/19 1229 11/06/19 1955 11/06/19 1957 11/07/19 0400 11/07/19 1639 11/07/19 1700 11/08/19 0500 11/08/19 1700 11/08/19 1809 11/09/19 0420 11/10/19 0554 10/16/2019 0555 11/12/19 0121  NA  --  136  --    < > 141   < >  --  145 147* 145 146* 151* 154* 154*  K  --  5.9*  --    < > 4.6   < >  --  3.4* 3.5 3.3* 3.2* 3.8 3.9 3.7  CL   < > 105  --   --  104  --   --  102 103  --  102 108 110 111  CO2   < > 19*  --   --  26  --   --  31 31  --  34* 33* 29 27  GLUCOSE   < > 204*  --    --  225*  --   --  346* 365*  --  357* 258* 167* 193*  BUN   < > 83*  --   --  92*  --   --  114* 121*  --  143* 157* 196* 203*  CREATININE   < > 3.42*  --   --  3.82*  --   --  3.33* 3.07*  --  3.18* 3.06* 3.60* 3.96*  CALCIUM   < > 7.9*  --   --  7.5*  --   --  7.4* 7.6*  --  7.9* 8.2* 7.8* 7.7*  PHOS  --  7.4* 7.5*  --  7.6*  --  8.7*  --   --   --   --   --   --   --    < > = values in this interval not displayed.   CBC Recent Labs  Lab 11/10/19 0554 11/10/19 1836 11/06/2019 0030 10/20/2019 0555 10/28/2019 2233 11/12/19 0121  WBC 21.2*   < > 27.5* 31.7* 24.2* 23.1*  NEUTROABS 19.6*  --   --   --   --  20.3*  HGB 7.1*   < > 7.6* 8.3* 7.8* 7.6*  HCT 24.3*   < > 24.1* 25.8* 24.8* 24.4*  MCV 100.8*   < > 97.2 95.2 95.8 97.2  PLT 172   < > 160 153 129* 120*   < > = values in this interval not displayed.    Medications:    . chlorhexidine gluconate (MEDLINE KIT)  15 mL Mouth Rinse BID  . Chlorhexidine Gluconate Cloth  6 each Topical Daily  . free water  300 mL Per Tube Q4H  . insulin aspart  0-20 Units Subcutaneous Q4H  . insulin aspart  8 Units Subcutaneous Q4H  . mouth rinse  15 mL Mouth Rinse 10 times per day  . metoCLOPramide (REGLAN) injection  10 mg Intravenous Once  . [START ON November 23, 2019] pantoprazole  40 mg Intravenous Q12H  . sodium chloride flush  10-40 mL Intracatheter Q12H      Madelon Lips, MD 11/12/2019, 5:34 PM

## 2019-11-12 NOTE — Progress Notes (Signed)
eLink Physician-Brief Progress Note Patient Name: Colton Fox DOB: January 21, 1946 MRN: [id number]   Date of Service  11/12/2019  HPI/Events of Note  Remains asynchronous with ventilator in spite of heavy sedation.   eICU Interventions  Will order: 1. Continuous NMB per protocol.  2.Sedate with Fentanyl anf Versed IV infusions.      Intervention Category Major Interventions: Respiratory failure - evaluation and management  Nahla Lukin [name] 11/12/2019, 11:21 PM

## 2019-11-12 NOTE — Progress Notes (Signed)
RT obtained ABG on patient on previous vent settings. Vent changes were made and will recollect ABG in 30 minutes. RT attempted to preform recruitment maneuver on patient with no success due to patient being dyssynchronous with vent.

## 2019-11-12 DEATH — deceased

## 2019-11-13 ENCOUNTER — Inpatient Hospital Stay (HOSPITAL_COMMUNITY): Payer: Medicare Other

## 2019-11-13 DIAGNOSIS — J1282 Pneumonia due to coronavirus disease 2019: Secondary | ICD-10-CM

## 2019-11-13 DIAGNOSIS — D62 Acute posthemorrhagic anemia: Secondary | ICD-10-CM

## 2019-11-13 DIAGNOSIS — Z992 Dependence on renal dialysis: Secondary | ICD-10-CM

## 2019-11-13 LAB — MAGNESIUM
Magnesium: 2.6 mg/dL — ABNORMAL HIGH (ref 1.7–2.4)
Magnesium: 2.8 mg/dL — ABNORMAL HIGH (ref 1.7–2.4)

## 2019-11-13 LAB — RENAL FUNCTION PANEL
Albumin: 1.8 g/dL — ABNORMAL LOW (ref 3.5–5.0)
Albumin: 1.7 g/dL — ABNORMAL LOW (ref 3.5–5.0)
Anion gap: 12 (ref 5–15)
Anion gap: 13 (ref 5–15)
BUN: 139 mg/dL — ABNORMAL HIGH (ref 8–23)
BUN: 90 mg/dL — ABNORMAL HIGH (ref 8–23)
CO2: 28 mmol/L (ref 22–32)
CO2: 24 mmol/L (ref 22–32)
Calcium: 7.6 mg/dL — ABNORMAL LOW (ref 8.9–10.3)
Calcium: 7.5 mg/dL — ABNORMAL LOW (ref 8.9–10.3)
Chloride: 110 mmol/L (ref 98–111)
Chloride: 108 mmol/L (ref 98–111)
Creatinine, Ser: 2.57 mg/dL — ABNORMAL HIGH (ref 0.61–1.24)
Creatinine, Ser: 2.23 mg/dL — ABNORMAL HIGH (ref 0.61–1.24)
GFR calc Af Amer: 28 mL/min — ABNORMAL LOW (ref >60)
GFR calc Af Amer: 33 mL/min — ABNORMAL LOW (ref >60)
GFR calc non Af Amer: 24 mL/min — ABNORMAL LOW (ref >60)
GFR calc non Af Amer: 28 mL/min — ABNORMAL LOW (ref >60)
Glucose, Bld: 155 mg/dL — ABNORMAL HIGH (ref 70–99)
Glucose, Bld: 104 mg/dL — ABNORMAL HIGH (ref 70–99)
Phosphorus: 5.3 mg/dL — ABNORMAL HIGH (ref 2.5–4.6)
Phosphorus: 5.5 mg/dL — ABNORMAL HIGH (ref 2.5–4.6)
Potassium: 4.2 mmol/L (ref 3.5–5.1)
Potassium: 4.9 mmol/L (ref 3.5–5.1)
Sodium: 150 mmol/L — ABNORMAL HIGH (ref 135–145)
Sodium: 145 mmol/L (ref 135–145)

## 2019-11-13 LAB — POCT I-STAT 7, (LYTES, BLD GAS, ICA,H+H)
Acid-Base Excess: 3 mmol/L — ABNORMAL HIGH (ref 0.0–2.0)
Acid-base deficit: 3 mmol/L — ABNORMAL HIGH (ref 0.0–2.0)
Acid-base deficit: 6 mmol/L — ABNORMAL HIGH (ref 0.0–2.0)
Bicarbonate: 28.6 mmol/L — ABNORMAL HIGH (ref 20.0–28.0)
Bicarbonate: 25 mmol/L (ref 20.0–28.0)
Bicarbonate: 24.3 mmol/L (ref 20.0–28.0)
Calcium, Ion: 1.11 mmol/L — ABNORMAL LOW (ref 1.15–1.40)
Calcium, Ion: 1.09 mmol/L — ABNORMAL LOW (ref 1.15–1.40)
Calcium, Ion: 1.07 mmol/L — ABNORMAL LOW (ref 1.15–1.40)
HCT: 26 % — ABNORMAL LOW (ref 39.0–52.0)
HCT: 29 % — ABNORMAL LOW (ref 39.0–52.0)
HCT: 29 % — ABNORMAL LOW (ref 39.0–52.0)
Hemoglobin: 8.8 g/dL — ABNORMAL LOW (ref 13.0–17.0)
Hemoglobin: 9.9 g/dL — ABNORMAL LOW (ref 13.0–17.0)
Hemoglobin: 9.9 g/dL — ABNORMAL LOW (ref 13.0–17.0)
O2 Saturation: 92 %
O2 Saturation: 88 %
O2 Saturation: 78 %
Patient temperature: 97.8
Patient temperature: 97.6
Patient temperature: 97.9
Potassium: 3.7 mmol/L (ref 3.5–5.1)
Potassium: 4.9 mmol/L (ref 3.5–5.1)
Potassium: 5.8 mmol/L — ABNORMAL HIGH (ref 3.5–5.1)
Sodium: 150 mmol/L — ABNORMAL HIGH (ref 135–145)
Sodium: 141 mmol/L (ref 135–145)
Sodium: 142 mmol/L (ref 135–145)
TCO2: 30 mmol/L (ref 22–32)
TCO2: 27 mmol/L (ref 22–32)
TCO2: 27 mmol/L (ref 22–32)
pCO2 arterial: 49.4 mmHg — ABNORMAL HIGH (ref 32.0–48.0)
pCO2 arterial: 57.7 mmHg — ABNORMAL HIGH (ref 32.0–48.0)
pCO2 arterial: 78.3 mmHg (ref 32.0–48.0)
pH, Arterial: 7.368 (ref 7.350–7.450)
pH, Arterial: 7.242 — ABNORMAL LOW (ref 7.350–7.450)
pH, Arterial: 7.097 — CL (ref 7.350–7.450)
pO2, Arterial: 66 mmHg — ABNORMAL LOW (ref 83.0–108.0)
pO2, Arterial: 64 mmHg — ABNORMAL LOW (ref 83.0–108.0)
pO2, Arterial: 57 mmHg — ABNORMAL LOW (ref 83.0–108.0)

## 2019-11-13 LAB — TYPE AND SCREEN
ABO/RH(D): A POS
Antibody Screen: NEGATIVE
Unit division: 0

## 2019-11-13 LAB — GLUCOSE, CAPILLARY
Glucose-Capillary: 133 mg/dL — ABNORMAL HIGH (ref 70–99)
Glucose-Capillary: 140 mg/dL — ABNORMAL HIGH (ref 70–99)
Glucose-Capillary: 141 mg/dL — ABNORMAL HIGH (ref 70–99)
Glucose-Capillary: 176 mg/dL — ABNORMAL HIGH (ref 70–99)
Glucose-Capillary: 65 mg/dL — ABNORMAL LOW (ref 70–99)
Glucose-Capillary: 89 mg/dL (ref 70–99)
Glucose-Capillary: 94 mg/dL (ref 70–99)

## 2019-11-13 LAB — BPAM RBC
Blood Product Expiration Date: 202102242359
ISSUE DATE / TIME: 202102011204
Unit Type and Rh: 6200

## 2019-11-13 LAB — PATHOLOGIST SMEAR REVIEW

## 2019-11-13 MED ORDER — CHLORHEXIDINE GLUCONATE 0.12 % MT SOLN
OROMUCOSAL | Status: AC
  Filled 2019-11-13: qty 15

## 2019-11-13 MED ORDER — VASOPRESSIN 20 UNIT/ML IV SOLN
0.0300 [IU]/min | INTRAVENOUS | Status: DC
  Administered 2019-11-13 – 2019-11-14 (×3): 0.03 [IU]/min via INTRAVENOUS
  Filled 2019-11-13 (×2): qty 2

## 2019-11-13 MED ORDER — SODIUM BICARBONATE 8.4 % IV SOLN
100.0000 meq | Freq: Once | INTRAVENOUS | Status: AC
  Administered 2019-11-13: 23:00:00 100 meq via INTRAVENOUS
  Filled 2019-11-13: qty 100

## 2019-11-13 MED ORDER — SODIUM BICARBONATE 8.4 % IV SOLN
100.0000 meq | Freq: Once | INTRAVENOUS | Status: AC
  Administered 2019-11-13: 100 meq via INTRAVENOUS
  Filled 2019-11-13: qty 100

## 2019-11-13 MED ORDER — SODIUM BICARBONATE-DEXTROSE 150-5 MEQ/L-% IV SOLN
150.0000 meq | INTRAVENOUS | Status: DC
  Administered 2019-11-14: 150 meq via INTRAVENOUS
  Filled 2019-11-13: qty 1000

## 2019-11-13 MED ORDER — VECURONIUM BROMIDE 10 MG IV SOLR: 10.0000 mg | Freq: Once | INTRAVENOUS | Status: AC

## 2019-11-13 MED ORDER — VECURONIUM BROMIDE 10 MG IV SOLR
INTRAVENOUS | Status: AC
  Administered 2019-11-13: 19:00:00 10 mg via INTRAVENOUS
  Filled 2019-11-13: qty 10

## 2019-11-13 MED ORDER — PHENYLEPHRINE CONCENTRATED 100MG/250ML (0.4 MG/ML) INFUSION SIMPLE
0.0000 ug/min | INTRAVENOUS | Status: DC
  Filled 2019-11-13: qty 250

## 2019-11-13 MED ORDER — NOREPINEPHRINE 16 MG/250ML-% IV SOLN
0.0000 ug/min | INTRAVENOUS | Status: DC
  Administered 2019-11-13: 22:00:00 30 ug/min via INTRAVENOUS
  Administered 2019-11-13: 15:00:00 15 ug/min via INTRAVENOUS
  Administered 2019-11-14 (×2): 70 ug/min via INTRAVENOUS
  Filled 2019-11-13 (×5): qty 250

## 2019-11-13 MED ORDER — PANTOPRAZOLE SODIUM 40 MG IV SOLR: 40.0000 mg | Freq: Two times a day (BID) | INTRAVENOUS | Status: DC

## 2019-11-13 MED ORDER — NOREPINEPHRINE 4 MG/250ML-% IV SOLN
0.0000 ug/min | INTRAVENOUS | Status: DC
  Administered 2019-11-13: 09:00:00 2 ug/min via INTRAVENOUS
  Filled 2019-11-13: qty 250

## 2019-11-13 MED ORDER — DEXTROSE 50 % IV SOLN
INTRAVENOUS | Status: AC
  Administered 2019-11-13: 20:00:00 25 mL via INTRAVENOUS
  Filled 2019-11-13: qty 50

## 2019-11-13 MED ORDER — PHENYLEPHRINE HCL-NACL 10-0.9 MG/250ML-% IV SOLN
0.0000 ug/min | INTRAVENOUS | Status: DC
  Administered 2019-11-13: 07:00:00 100 ug/min via INTRAVENOUS
  Administered 2019-11-13: 05:00:00 5 ug/min via INTRAVENOUS
  Filled 2019-11-13 (×2): qty 250

## 2019-11-13 NOTE — Progress Notes (Signed)
RT note-Patient has been placed in prone position at this time without difficulties. Remains on current ventilator settings.

## 2019-11-13 NOTE — Progress Notes (Signed)
Patient has been placed back in the supine position at this time without any difficulties. Tape removed from tube and tube holder placed back on.

## 2019-11-13 NOTE — Progress Notes (Signed)
eLink Physician-Brief Progress Note Patient Name: Colton Fox DOB: 01/19/1946 MRN: [id number]   Date of Service  11/13/2019  HPI/Events of Note  Episode of NSVT X 2. K+ = 4.2.   eICU Interventions  Will order: 1. Mg++ level STAT.     Intervention Category Major Interventions: Arrhythmia - evaluation and management  Yukiko Minnich Cornelia Copa 11/13/2019, 4:38 AM

## 2019-11-13 NOTE — Progress Notes (Signed)
Colton Fox Progress Note    Assessment/ Plan:   1. AKI / renal failure - may have underlying CKD. Creat at outside hospital was 1.9- 2.7. Renal US w/ nl kidneys.   Azotemia from GIB and steroids.  Started CRRT 2/1, continue.      2. BP/volume - as above 3. COVID +PNA - on vent, sp remdesivir/ decadron  4. Shock: on pressors per primary 5. DM on insulin  6. H/o CAD sp stent 7. H/o prostate cancer 8. UGIB: s/p EGD with cratered gastric ulcers GI following.   9. Dispo: critically ill  Subjective:    Continues on CRRT, on pressor   Objective:   BP (!) 93/45   Pulse 66   Temp 97.6 F (36.4 C) (Axillary)   Resp 20   Ht '6\' 1"'$  (1.854 m)   Wt 125.2 kg   SpO2 93%   BMI 36.42 kg/m   Intake/Output Summary (Last 24 hours) at 11/13/2019 1653 Last data filed at 11/13/2019 1600 Gross per 24 hour  Intake 2146.98 ml  Output 3069 ml  Net -922.02 ml   Weight change: 1.3 kg  Physical Exam: Gen: intubated, unresponsive CVS:RRR no rub Resp: coarse bilateral breath sounds Abd: soft Ext:2+ anasarca  Imaging: DG Chest 1 View  Result Date: 11/12/2019 CLINICAL DATA:  74 year old male with central line placement. EXAM: CHEST  1 VIEW COMPARISON:  Earlier radiograph dated 11/12/2019. FINDINGS: Interval placement of a left IJ central venous line with tip over central SVC. No pneumothorax. Endotracheal tube and right IJ central line appear in similar position. Bilateral mid to lower lung field interstitial and airspace densities similar or slightly improved. Stable cardiomediastinal silhouette. No acute osseous pathology. IMPRESSION: Interval placement of a left IJ central venous line with tip over central SVC. No pneumothorax. Electronically Signed   By: Anner Crete M.D.   On: 11/12/2019 19:58   DG CHEST PORT 1 VIEW  Result Date: 11/13/2019 CLINICAL DATA:  COVID positive.  Pneumothorax. EXAM: PORTABLE CHEST 1 VIEW COMPARISON:  11/13/2019. FINDINGS: Endotracheal tube and  bilateral IJ lines in stable position. Heart size stable. Multifocal bilateral pulmonary infiltrates noted with interim worsening from prior exam. Previously identified small right apical pneumothorax not identified on today's exam. Pneumomediastinum appears to be present. Mild right neck subcutaneous emphysema noted. IMPRESSION: 1.  Endotracheal tube and bilateral IJ lines in stable position. 2. Multifocal bilateral pulmonary infiltrates again noted with interim progression from prior exam. 3. Right apical pneumothorax no longer visualized. Pneumomediastinum appears to be present. Right neck subcutaneous emphysema noted. Electronically Signed   By: Marcello Moores  Register   On: 11/13/2019 12:32   DG CHEST PORT 1 VIEW  Result Date: 11/13/2019 CLINICAL DATA:  COVID-19 positive, intubated, previous abnormal chest x-ray EXAM: PORTABLE CHEST 1 VIEW COMPARISON:  11/12/2019 FINDINGS: Single frontal view of the chest demonstrates stable position of the endotracheal tube, left internal jugular dialysis catheter, and right internal jugular catheter. There is interval development of a small right apical pneumothorax volume estimated less than 5%. Bibasilar interstitial and ground-glass opacities are unchanged. No large effusion. No acute bony abnormalities. IMPRESSION: 1. Interval development of trace right apical pneumothorax, volume estimated less than 5%. 2. Stable support devices and findings of bibasilar pneumonia Findings called to the ICU and given to the patient's nurse by myself at 8:55 a.m. Electronically Signed   By: Randa Ngo M.D.   On: 11/13/2019 08:55   DG CHEST PORT 1 VIEW  Result Date: 11/12/2019 CLINICAL DATA:  Intubation, COVID EXAM: PORTABLE CHEST 1 VIEW COMPARISON:  10/23/2019 FINDINGS: Slight interval worsening of extensive bilateral heterogeneous and interstitial airspace opacity, most conspicuous in the right midlung and left lung base. Interval removal of esophagogastric tube. Endotracheal tube and  right neck vascular catheter remain in position. Subtle air lucency about the mediastinal margins and left heart border again seen, suspicious for pneumomediastinum. No pneumothorax. Mild cardiomegaly. IMPRESSION: 1. Slight interval worsening of extensive bilateral heterogeneous and interstitial airspace opacity, most conspicuous in the right midlung and left lung base, consistent with slightly worsened COVID pneumonia. 2. Interval removal of esophagogastric tube. Other support apparatus remains unchanged. 3. Subtle air lucency about the mediastinal margins and left heart border again seen, suspicious for pneumomediastinum in the setting of ventilation. No pneumothorax. Electronically Signed   By: Eddie Candle M.D.   On: 11/12/2019 12:32    Labs: BMET Recent Labs  Lab 11/06/19 1955 11/06/19 1957 11/07/19 0400 11/07/19 1639 11/07/19 1700 11/08/19 0500 11/09/19 0420 11/09/19 0420 11/10/19 0554 11/10/19 0554 11/06/2019 0555 10/19/2019 0555 11/12/19 0121 11/12/19 2201 11/12/19 2202 11/12/19 2247 11/13/19 0150 11/13/19 0333 11/13/19 1600  NA  --    < > 141   < >  --    < > 146*   < > 151*   < > 154*   < > 154* 153* 153* 152* 150* 150* 145  K  --    < > 4.6   < >  --    < > 3.2*   < > 3.8   < > 3.9   < > 3.7 3.2* 3.5 3.6 3.7 4.2 4.9  CL  --   --  104  --   --    < > 102  --  108  --  110  --  111  --  113*  --   --  110 108  CO2  --   --  26  --   --    < > 34*  --  33*  --  29  --  27  --  27  --   --  28 24  GLUCOSE  --   --  225*  --   --    < > 357*  --  258*  --  167*  --  193*  --  121*  --   --  155* 104*  BUN  --   --  92*  --   --    < > 143*  --  157*  --  196*  --  203*  --  181*  --   --  139* 90*  CREATININE  --   --  3.82*  --   --    < > 3.18*  --  3.06*  --  3.60*  --  3.96*  --  3.19*  --   --  2.57* 2.23*  CALCIUM  --   --  7.5*  --   --    < > 7.9*  --  8.2*  --  7.8*  --  7.7*  --  7.8*  --   --  7.6* 7.5*  PHOS 7.5*  --  7.6*  --  8.7*  --   --   --   --   --   --   --    --   --   --   --   --  5.3* 5.5*   < > = values in  this interval not displayed.   CBC Recent Labs  Lab 11/10/19 0554 11/10/19 1836 11/10/2019 0555 10/26/2019 0555 10/21/2019 2233 10/26/2019 2233 11/12/19 0121 11/12/19 0121 11/12/19 2201 11/12/19 2202 11/12/19 2247 11/13/19 0150  WBC 21.2*   < > 31.7*  --  24.2*  --  23.1*  --   --  14.5*  --   --   NEUTROABS 19.6*  --   --   --   --   --  20.3*  --   --   --   --   --   HGB 7.1*   < > 8.3*   < > 7.8*   < > 7.6*   < > 7.8* 9.2* 9.2* 8.8*  HCT 24.3*   < > 25.8*   < > 24.8*   < > 24.4*   < > 23.0* 29.6* 27.0* 26.0*  MCV 100.8*   < > 95.2  --  95.8  --  97.2  --   --  97.7  --   --   PLT 172   < > 153  --  129*  --  120*  --   --  DCUMP  --   --    < > = values in this interval not displayed.    Medications:    . chlorhexidine gluconate (MEDLINE KIT)  15 mL Mouth Rinse BID  . Chlorhexidine Gluconate Cloth  6 each Topical Daily  . insulin aspart  0-20 Units Subcutaneous Q4H  . mouth rinse  15 mL Mouth Rinse 10 times per day  . [START ON 11/16/2019] pantoprazole (PROTONIX) IV  40 mg Intravenous Q12H  . sodium chloride flush  10-40 mL Intracatheter Q12H      Madelon Lips, MD 11/13/2019, 4:53 PM

## 2019-11-13 NOTE — Progress Notes (Addendum)
[place] Progress Note Patient Name: Colton Fox DOB: 03/10/1946 MRN: [id number]   Date of Service  11/13/2019  HPI/Events of Note  ABG on 80%/PRVC 20/TV 480/P 12 = 7.097/78.3/57.0  eICU Interventions  Will order: 1. NaHCO3 100 meq IV now. 2. D5 NaHCO3 IV infusion to run IV at 75 mL/hour. 3. Increase PRVC rate to 30. 4. Repeat ABG at 2 AM.     Intervention Category Major Interventions: Acid-Base disturbance - evaluation and management;Respiratory failure - evaluation and management  Colton Fox [name] 11/13/2019, 11:02 PM

## 2019-11-13 NOTE — Progress Notes (Signed)
NAME:  Colton Fox, MRN:  [id number], DOB:  [birth year], LOS: 8 ADMISSION DATE:  10/29/2019, CONSULTATION DATE:  10/22/2019 REFERRING MD:  OSH, CHIEF COMPLAINT:  Acute hypoxemic resp failure 2/2 COVID  Brief History   74 y/o male, COVID positive who presented to OSH 1/13 after 4 days of feeling poorly, dx/d with COVID and acute hypoxic resp failure, NSTEMI and AKI on CKD.  He decompensated slowly to warrant ETT placement 1/24. Transferred to [facility] 1/25.   Past Medical History  DM  CKD  HTN HLD CAD s/p PCI PAD GIB Appendectomy Prostate Cancer  Significant Hospital Events   1/13 Admit to OSH with 4 day hx of feeling poorly, COVID + 1/24 Intubated at OSH  1/25 Tx to [facility], prone positioning  1/27 Nephrology consulted 1/28 Bicarb gtt stopped, modestly improved renal function. Hyperglycemia 11/12/2019: HD catheter placed, CVVHD started  Consults:    Procedures:  ETT 1/24 >>  EGD 1/31 HD catheter 2/1 CVVHD 2/1  Significant Diagnostic Tests:  Renal US 1/26 >> sub-optimal images but negative for hydronephrosis LE Venous Duplex 1/26 >> negative for DVT ECHO 1/26 >> LV with mild concentric hypertrophy, global hypokinesis, LVEF ~45-50%  Micro Data:  BCx2 1/25 >> ng MRSA PCXR 1/25 >> negative  UC 1/25 >> negative U. Strep Antigen 1/26 >> negative    Antimicrobials:  Remdesivir completed  Interim history/subjective:   Patient remains critically ill intubated on mechanical life support.  CVVHD started last night.  Tolerating well.  Had some hypotension norepinephrine started  Objective   Blood pressure (!) 93/45, pulse 66, temperature 97.6 F (36.4 C), temperature source Axillary, resp. rate 20, height 6\' 1"  (1.854 m), weight 125.2 kg, SpO2 93 %. CVP:  [0 mmHg-8 mmHg] 5 mmHg  Vent Mode: PRVC FiO2 (%):  [40 %-80 %] 80 % Set Rate:  [20 bmp] 20 bmp Vt Set:  [480 mL] 480 mL PEEP:  [10 cmH20-12 cmH20] 12 cmH20 Plateau Pressure:  [23 cmH20-26 cmH20] 26 cmH20   Intake/Output  Summary (Last 24 hours) at 11/13/2019 1638 Last data filed at 11/13/2019 1600 Gross per 24 hour  Intake 2146.98 ml  Output 3069 ml  Net -922.02 ml   Filed Weights   11/03/2019 0200 11/12/19 0406 11/13/19 0337  Weight: 120.3 kg 123.9 kg 125.2 kg    Examination: General appearance: 74 y.o., male Eyes: anicteric sclerae HENT: NCAT, mucous membranes moist endotracheal tube in place Neck: Trachea midline Lungs: Bilateral ventilated breath sounds, no crackles, no wheeze CV: RRR, S1, S2, no MRGs  Abdomen: Soft, non-tender; non-distended, BS present  Extremities: No peripheral edema, radial and DP pulses present bilaterally  Skin: Normal temperature, turgor and texture; no rash Psych: Appropriate affect Neuro: Alert and oriented to person and place, no focal deficit    Chest x-ray 11/13/2019: Morning chest x-ray with questionable right upper pneumothorax. Follow-up chest x-ray 11/13/2019: With resolution of this.  There is air within the neck and mediastinum consistent with pneumomediastinum. The patient's images have been independently reviewed by me.    Labs reviewed: Sodium 150, potassium 4.2, BUN 139, creatinine 2.57.  Resolved Hospital Problem list     Assessment & Plan:   ARDS secondary to bilateral COVID PNA  pneumomediastinum Continue mechanical ventilation per ARDS protocol Target TVol 6-8cc/kgIBW Target Plateau Pressure < 30cm H20 Target driving pressure less than 15 cm of water Target PaO2 55-65: titrate PEEP/FiO2 per protocol As long as PaO2 to FiO2 ratio is less than 1:150 position in prone position for  16 hours a day Check CVP daily if CVL in place Target CVP less than 4, diurese as necessary Ventilator associated pneumonia prevention protocol Fluid management with CVVHD. Repeated chest x-ray this afternoon with no evidence of pneumothorax. Due to worsening P: F ratio will recommend proning, repeat ABG pending  Hypotension, likely medication effect, hemoglobin  stable -Norepinephrine initiated, titrated to maintain mean arterial pressure greater than 65 mmHg  Acute upper GI bleeding, hemorrhagic shock, resolved -Continue to observe H&H -EGD completed on 11/10/2019 -Gastric ulcer was bleeding, adherent clot. -Continue Protonix infusion per GI recommendations. -We appreciate gastroenterology input.  Sedation Needs secondary to Mechanical Ventilation  Continue PAD protocol sedation, Versed plus fentanyl  HFrEF CVVHD started for volume removal.  AKI on CKD Acute renal failure HD catheter placed yesterday evening. -CVVHD per nephrology for volume removal. -We appreciate nephrology input.  Hypernatremia -Any free water replacement  Anemia  Need to follow H&H  DM II -uncontrolled hyperglycemia due to steroids SSI  Hx HTN, HLD, CAD s/p PCI, PAD Continue to hold home BP meds and cardiac medications  Best practice:  Diet: Holding due to no enteral access Pain/Anxiety/Delirium protocol (if indicated): protocol VAP protocol (if indicated): yes DVT prophylaxis: Holding GI prophylaxis: Protonix infusion Glucose control: SSI Mobility: bedrest Code Status: Full Code  Family Communication: I called and spoke with the patient's wife via phone Disposition: ICU  This patient is critically ill with multiple organ system failure; which, requires frequent high complexity decision making, assessment, support, evaluation, and titration of therapies. This was completed through the application of advanced monitoring technologies and extensive interpretation of multiple databases. During this encounter critical care time was devoted to patient care services described in this note for 34 minutes.  Garner Nash, DO [facility] 11/13/2019 4:38 PM

## 2019-11-13 NOTE — Progress Notes (Signed)
[place] Progress Note Patient Name: Colton Fox DOB: [birth year] MRN: [id number]   Date of Service  11/13/2019  HPI/Events of Note  Hypotension and Hypoxia - Sat 83% and BP = 87/45 with MAP = 55%.  eICU Interventions  Will order: 1. NaHCO3 100 meq IV now.  2. Decrease PRVC rate to 25.     Intervention Category Major Interventions: Hypotension - evaluation and management;Hypoxemia - evaluation and management  Lysle Dingwall 11/13/2019, 11:39 PM

## 2019-11-13 NOTE — Progress Notes (Signed)
[place] Progress Note Patient Name: Colton Fox DOB: [birth year] MRN: [id number]   Date of Service  11/13/2019  HPI/Events of Note  Hypotensiion - BP = 105/45 with MAP 60.   eICU Interventions  Will order: 1. Phenylephrine IV infusion. Titrate to MAP >= 65.      Intervention Category Major Interventions: Hypotension - evaluation and management  Jane Birkel [name] 11/13/2019, 2:06 AM

## 2019-11-13 NOTE — Progress Notes (Signed)
Notified by radiologist pt's [phone] chest xray showed trace R apical pneumothorax less than 5%. Dr. Valeta Harms notified, order for repeat chest xray at 12pm.

## 2019-11-13 NOTE — Progress Notes (Signed)
Pt started on Fentanyl and versed per orders for Neuromuscular Blockade protocol at 2335.  Currently, Fentanyl at 471mcg and Versed at 10mg  continuous infusion with multiple boluses of both.  Patient BIS started at 100 and has decreased to 85 but no further despite boluses and infusion.  Pt heart rate lower and BP lower than it has been throughout the night thus far.  Discussed with elink and it was decided to treat the patient and not the number as the patient appears much more sedated since beginning of shift.  Will use BIS 85 as patient sedation point and treat any changes in HR, BP or increase in BIS.  Will continue to monitor very closely.

## 2019-11-13 NOTE — Progress Notes (Signed)
[facility] Progress Note  Colton Fox 74 y.o. [birth year]   Subjective: No further bleeding. Spoke with nurse.  Objective: Vital signs: Vitals:   11/13/19 0840 11/13/19 1122  BP:    Pulse:    Resp:    Temp:    SpO2: 91% 91%  T 97.6, P 93, BP 93/45  Physical Exam: Not examined due to Covid positivity  Lab Results: Recent Labs    11/12/19 0121 11/12/19 2201 11/12/19 2202 11/12/19 2247 11/13/19 0150 11/13/19 0333  NA 154*   < > 153*   < > 150* 150*  K 3.7   < > 3.5   < > 3.7 4.2  CL 111  --  113*  --   --  110  CO2 27  --  27  --   --  28  GLUCOSE 193*  --  121*  --   --  155*  BUN 203*  --  181*  --   --  139*  CREATININE 3.96*  --  3.19*  --   --  2.57*  CALCIUM 7.7*  --  7.8*  --   --  7.6*  MG 3.0*  --   --   --   --  2.6*  PHOS  --   --   --   --   --  5.3*   < > = values in this interval not displayed.   Recent Labs    11/13/19 0333  ALBUMIN 1.8*   Recent Labs    11/12/19 0121 11/12/19 2201 11/12/19 2202 11/12/19 2202 11/12/19 2247 11/13/19 0150  WBC 23.1*  --  14.5*  --   --   --   NEUTROABS 20.3*  --   --   --   --   --   HGB 7.6*   < > 9.2*   < > 9.2* 8.8*  HCT 24.4*   < > 29.6*   < > 27.0* 26.0*  MCV 97.2  --  97.7  --   --   --   PLT 120*  --  DCUMP  --   --   --    < > = values in this interval not displayed.      Assessment/Plan: COVID with ARDS- intubated, managed per CCM. S/P GI bleed from gastric ulcers - would not place any Fox/OG tubes until further notice due to large gastric ulcer. No melena since EGD. Hgb 8.8. Worsening hemodynamics earlier today on Levophed and Vasopressin. No plans for repeat EGD. Continue Protonix drip for an additional 3 days from now and then change to IV Q 12 hours. Will sign off. Call us back if needed.   Colton Fox 11/13/2019, 3:43 PM  Questions please call [phone] ID: Colton Fox, male   DOB: 1946/01/31, 74 y.o.   MRN: [id number]

## 2019-11-13 NOTE — Progress Notes (Signed)
CRITICAL VALUE ALERT  Critical Value:  ABG 7.097/78.3/57/24.3  Date & Time Notied:  11/13/2019 @ 9122  Provider Notified: Warren Lacy MD  Orders Received/Actions taken: [facility] made aware, RN & RT aware.

## 2019-11-14 ENCOUNTER — Inpatient Hospital Stay (HOSPITAL_COMMUNITY): Payer: Medicare Other

## 2019-11-14 LAB — POCT I-STAT 7, (LYTES, BLD GAS, ICA,H+H)
Acid-Base Excess: 1 mmol/L (ref 0.0–2.0)
Acid-Base Excess: 1 mmol/L (ref 0.0–2.0)
Acid-base deficit: 3 mmol/L — ABNORMAL HIGH (ref 0.0–2.0)
Bicarbonate: 29.4 mmol/L — ABNORMAL HIGH (ref 20.0–28.0)
Bicarbonate: 29.5 mmol/L — ABNORMAL HIGH (ref 20.0–28.0)
Bicarbonate: 25.8 mmol/L (ref 20.0–28.0)
Calcium, Ion: 0.96 mmol/L — ABNORMAL LOW (ref 1.15–1.40)
Calcium, Ion: 0.98 mmol/L — ABNORMAL LOW (ref 1.15–1.40)
Calcium, Ion: 0.91 mmol/L — ABNORMAL LOW (ref 1.15–1.40)
HCT: 25 % — ABNORMAL LOW (ref 39.0–52.0)
HCT: 24 % — ABNORMAL LOW (ref 39.0–52.0)
HCT: 26 % — ABNORMAL LOW (ref 39.0–52.0)
Hemoglobin: 8.5 g/dL — ABNORMAL LOW (ref 13.0–17.0)
Hemoglobin: 8.2 g/dL — ABNORMAL LOW (ref 13.0–17.0)
Hemoglobin: 8.8 g/dL — ABNORMAL LOW (ref 13.0–17.0)
O2 Saturation: 82 %
O2 Saturation: 80 %
O2 Saturation: 78 %
Patient temperature: 94.1
Patient temperature: 94.3
Potassium: 5 mmol/L (ref 3.5–5.1)
Potassium: 4.7 mmol/L (ref 3.5–5.1)
Potassium: 5.3 mmol/L — ABNORMAL HIGH (ref 3.5–5.1)
Sodium: 145 mmol/L (ref 135–145)
Sodium: 145 mmol/L (ref 135–145)
Sodium: 144 mmol/L (ref 135–145)
TCO2: 32 mmol/L (ref 22–32)
TCO2: 32 mmol/L (ref 22–32)
TCO2: 28 mmol/L (ref 22–32)
pCO2 arterial: 64.6 mmHg — ABNORMAL HIGH (ref 32.0–48.0)
pCO2 arterial: 65.3 mmHg (ref 32.0–48.0)
pCO2 arterial: 65.5 mmHg (ref 32.0–48.0)
pH, Arterial: 7.253 — ABNORMAL LOW (ref 7.350–7.450)
pH, Arterial: 7.25 — ABNORMAL LOW (ref 7.350–7.450)
pH, Arterial: 7.203 — ABNORMAL LOW (ref 7.350–7.450)
pO2, Arterial: 48 mmHg — ABNORMAL LOW (ref 83.0–108.0)
pO2, Arterial: 47 mmHg — ABNORMAL LOW (ref 83.0–108.0)
pO2, Arterial: 53 mmHg — ABNORMAL LOW (ref 83.0–108.0)

## 2019-11-14 LAB — HEPATIC FUNCTION PANEL
ALT: 1122 U/L — ABNORMAL HIGH (ref 0–44)
AST: 1455 U/L — ABNORMAL HIGH (ref 15–41)
Albumin: 1.4 g/dL — ABNORMAL LOW (ref 3.5–5.0)
Alkaline Phosphatase: 184 U/L — ABNORMAL HIGH (ref 38–126)
Bilirubin, Direct: 0.8 mg/dL — ABNORMAL HIGH (ref 0.0–0.2)
Indirect Bilirubin: 0.7 mg/dL (ref 0.3–0.9)
Total Bilirubin: 1.5 mg/dL — ABNORMAL HIGH (ref 0.3–1.2)
Total Protein: 4.9 g/dL — ABNORMAL LOW (ref 6.5–8.1)

## 2019-11-14 LAB — RENAL FUNCTION PANEL
Albumin: 1.5 g/dL — ABNORMAL LOW (ref 3.5–5.0)
Anion gap: 16 — ABNORMAL HIGH (ref 5–15)
BUN: 73 mg/dL — ABNORMAL HIGH (ref 8–23)
CO2: 28 mmol/L (ref 22–32)
Calcium: 6.7 mg/dL — ABNORMAL LOW (ref 8.9–10.3)
Chloride: 105 mmol/L (ref 98–111)
Creatinine, Ser: 2.07 mg/dL — ABNORMAL HIGH (ref 0.61–1.24)
GFR calc Af Amer: 36 mL/min — ABNORMAL LOW (ref >60)
GFR calc non Af Amer: 31 mL/min — ABNORMAL LOW (ref >60)
Glucose, Bld: 132 mg/dL — ABNORMAL HIGH (ref 70–99)
Phosphorus: 6.6 mg/dL — ABNORMAL HIGH (ref 2.5–4.6)
Potassium: 4.7 mmol/L (ref 3.5–5.1)
Sodium: 149 mmol/L — ABNORMAL HIGH (ref 135–145)

## 2019-11-14 LAB — LACTIC ACID, PLASMA: Lactic Acid, Venous: 10.6 mmol/L (ref 0.5–1.9)

## 2019-11-14 LAB — MAGNESIUM: Magnesium: 2.5 mg/dL — ABNORMAL HIGH (ref 1.7–2.4)

## 2019-11-14 LAB — GLUCOSE, CAPILLARY
Glucose-Capillary: 123 mg/dL — ABNORMAL HIGH (ref 70–99)
Glucose-Capillary: 61 mg/dL — ABNORMAL LOW (ref 70–99)
Glucose-Capillary: 148 mg/dL — ABNORMAL HIGH (ref 70–99)

## 2019-11-14 MED ORDER — DEXTROSE 50 % IV SOLN: 12.5000 g | Freq: Once | INTRAVENOUS | Status: AC

## 2019-11-14 MED ORDER — ACETAMINOPHEN 325 MG PO TABS: 650.0000 mg | ORAL_TABLET | Freq: Four times a day (QID) | ORAL | Status: DC | PRN

## 2019-11-14 MED ORDER — POLYVINYL ALCOHOL 1.4 % OP SOLN
1.0000 [drp] | Freq: Four times a day (QID) | OPHTHALMIC | Status: DC | PRN
  Filled 2019-11-14: qty 15

## 2019-11-14 MED ORDER — SODIUM BICARBONATE 8.4 % IV SOLN
100.0000 meq | Freq: Once | INTRAVENOUS | Status: AC
  Administered 2019-11-14: 01:00:00 100 meq via INTRAVENOUS
  Filled 2019-11-14: qty 100

## 2019-11-14 MED ORDER — DIPHENHYDRAMINE HCL 50 MG/ML IJ SOLN: 25.0000 mg | INTRAMUSCULAR | Status: DC | PRN

## 2019-11-14 MED ORDER — DEXTROSE 50 % IV SOLN
INTRAVENOUS | Status: AC
  Filled 2019-11-14: qty 50

## 2019-11-14 MED ORDER — MORPHINE BOLUS VIA INFUSION
5.0000 mg | INTRAVENOUS | Status: DC | PRN
  Filled 2019-11-14: qty 5

## 2019-11-14 MED ORDER — ROCURONIUM BROMIDE 10 MG/ML (PF) SYRINGE
PREFILLED_SYRINGE | INTRAVENOUS | Status: AC
  Filled 2019-11-14: qty 10

## 2019-11-14 MED ORDER — GLYCOPYRROLATE 1 MG PO TABS: 1.0000 mg | ORAL_TABLET | ORAL | Status: DC | PRN

## 2019-11-14 MED ORDER — MORPHINE 100MG IN NS 100ML (1MG/ML) PREMIX INFUSION
0.0000 mg/h | INTRAVENOUS | Status: DC
  Filled 2019-11-14: qty 100

## 2019-11-14 MED ORDER — DEXTROSE 5 % IV SOLN: INTRAVENOUS | Status: DC

## 2019-11-14 MED ORDER — PROPOFOL 10 MG/ML IV BOLUS
INTRAVENOUS | Status: AC
  Filled 2019-11-14: qty 20

## 2019-11-14 MED ORDER — SODIUM BICARBONATE 8.4 % IV SOLN
100.0000 meq | Freq: Once | INTRAVENOUS | Status: AC
  Administered 2019-11-14: 06:00:00 50 meq via INTRAVENOUS
  Filled 2019-11-14: qty 50

## 2019-11-14 MED ORDER — ACETAMINOPHEN 650 MG RE SUPP: 650.0000 mg | Freq: Four times a day (QID) | RECTAL | Status: DC | PRN

## 2019-11-14 MED ORDER — STERILE WATER FOR INJECTION IJ SOLN
INTRAMUSCULAR | Status: AC
  Filled 2019-11-14: qty 10

## 2019-11-14 MED ORDER — VECURONIUM BROMIDE 10 MG IV SOLR
INTRAVENOUS | Status: AC
  Filled 2019-11-14: qty 10

## 2019-11-14 MED ORDER — SODIUM BICARBONATE-DEXTROSE 150-5 MEQ/L-% IV SOLN
150.0000 meq | Freq: Once | INTRAVENOUS | Status: AC
  Administered 2019-11-14: 10:00:00 150 meq via INTRAVENOUS

## 2019-11-14 MED ORDER — ETOMIDATE 2 MG/ML IV SOLN
INTRAVENOUS | Status: AC
  Filled 2019-11-14: qty 20

## 2019-11-14 MED ORDER — GLYCOPYRROLATE 0.2 MG/ML IJ SOLN: 0.2000 mg | INTRAMUSCULAR | Status: DC | PRN

## 2019-11-14 MED ORDER — MORPHINE SULFATE (PF) 2 MG/ML IV SOLN: 2.0000 mg | INTRAVENOUS | Status: DC | PRN

## 2019-11-14 MED ORDER — SODIUM BICARBONATE 8.4 % IV SOLN
100.0000 meq | Freq: Once | INTRAVENOUS | Status: AC
  Administered 2019-11-14: 03:00:00 100 meq via INTRAVENOUS
  Filled 2019-11-14: qty 100

## 2019-11-14 MED ORDER — MIDAZOLAM HCL 2 MG/2ML IJ SOLN
INTRAMUSCULAR | Status: AC
  Filled 2019-11-14: qty 4

## 2019-11-14 MED ORDER — FENTANYL CITRATE (PF) 100 MCG/2ML IJ SOLN
INTRAMUSCULAR | Status: AC
  Filled 2019-11-14: qty 2

## 2019-11-14 MED ORDER — SODIUM BICARBONATE-DEXTROSE 150-5 MEQ/L-% IV SOLN
150.0000 meq | INTRAVENOUS | Status: DC
  Administered 2019-11-14 (×3): 150 meq via INTRAVENOUS
  Filled 2019-11-14 (×3): qty 1000

## 2019-11-16 MED FILL — Sodium Chloride IV Soln 0.9%: INTRAVENOUS | Qty: 250 | Status: AC

## 2019-11-16 MED FILL — Fentanyl Citrate Preservative Free (PF) Inj 2500 MCG/50ML: INTRAMUSCULAR | Qty: 50 | Status: AC

## 2019-12-10 NOTE — Progress Notes (Signed)
Patient time of death 1250. Wife and grandson at the bedside following COVD-19 guidelines. CDS, and Dr. Tamala Julian notified. Patient without any belongings to return. 47mL Fentanyl and 40 mL Versed wasted with Maggie Font, RN

## 2019-12-10 NOTE — Consult Note (Deleted)
[place] Nurse Consult Note: Reason for Consult: Patient with 4-layer compression bandaging system on RLE. Placed by outpatient [facility] on Friday, 1/29.  Wound type: Neuropathic with venous insufficiency Pressure Injury POA: NA  Patient's compression bandaging system is in pristine condition, she is in bed and without added benefit of the heel strike achieved while ambulating. I am adding flotation of her heels and a silicone foam dressing to the sacrum to the POC. We will change on Monday, 2/8 if patient is still in house.  If she is discharged, please communicate to patient the need to reschedule her appointment with the outpatient [facility]/Dr. Dellia Nims for a dressing change.  [place] nursing team will see on Monday, 11/19/19 for RLE compression wrap change.   Thanks, Maudie Flakes, MSN, RN, [place], Arther Abbott  Pager# [phone]

## 2019-12-10 NOTE — Progress Notes (Signed)
Met with family. Discussed terminal nature of current clinical status. Agreement to allow Mr. Ricciuti to pass in peace. Orders placed.

## 2019-12-10 NOTE — Progress Notes (Signed)
[place] Progress Note Patient Name: Colton Fox DOB: June 05, 1946 MRN: [id number]   Date of Service  11-20-19  HPI/Events of Note  ABG on 100%/PRVC 25/TV 480/P = = 7.25/65.3/47.0.  eICU Interventions  Will order: 1. NaHCO3 100 meq IV now.  2. Repeat ABG at 8 AM.     Intervention Category Major Interventions: Acid-Base disturbance - evaluation and management;Respiratory failure - evaluation and management  Kanoelani Dobies [name] 11/20/2019, 5:18 AM

## 2019-12-10 NOTE — Progress Notes (Signed)
[place] Progress Note Patient Name: Colton Fox DOB: July 14, 1946 MRN: [id number]   Date of Service  08-Dec-2019  HPI/Events of Note  ABG on 100%/PRVC 25/TV 480/P 12 = 7.25/64.6/48.0.   eICU Interventions  Will order: 1. NaHCO3 100 meq IV now.  2. Repeat ABG at 5 AM.     Intervention Category Major Interventions: Acid-Base disturbance - evaluation and management;Respiratory failure - evaluation and management  Colton Fox 2019-12-08, 2:14 AM

## 2019-12-10 NOTE — Progress Notes (Signed)
   NAME:  Colton Fox, MRN:  [id number], DOB:  12-22-1945, LOS: 9 ADMISSION DATE:  10/27/2019, CONSULTATION DATE:  10/19/2019 REFERRING MD:  OSH, CHIEF COMPLAINT:  Acute hypoxemic resp failure 2/2 COVID  Brief History   74 y/o male, COVID positive who presented to OSH 1/13 after 4 days of feeling poorly, dx/d with COVID and acute hypoxic resp failure, NSTEMI and AKI on CKD.  He decompensated slowly to warrant ETT placement 1/24. Transferred to [facility] 1/25.   Past Medical History  DM  CKD  HTN HLD CAD s/p PCI PAD GIB Appendectomy Prostate Cancer  Significant Hospital Events   1/13 Admit to OSH with 4 day hx of feeling poorly, COVID + 1/24 Intubated at OSH  1/25 Tx to [facility], prone positioning  1/27 Nephrology consulted 1/28 Bicarb gtt stopped, modestly improved renal function. Hyperglycemia 11/12/2019: HD catheter placed, CVVHD started  Consults:    Procedures:  ETT 1/24 >>  EGD 1/31 HD catheter 2/1 CVVHD 2/1  Significant Diagnostic Tests:  Renal US 1/26 >> sub-optimal images but negative for hydronephrosis LE Venous Duplex 1/26 >> negative for DVT ECHO 1/26 >> LV with mild concentric hypertrophy, global hypokinesis, LVEF ~45-50%  Micro Data:  BCx2 1/25 >> ng MRSA PCXR 1/25 >> negative  UC 1/25 >> negative U. Strep Antigen 1/26 >> negative    Antimicrobials:  Remdesivir completed  Interim history/subjective:   Took a turn for worse overnight. Refractory acidemia and hypoglycemia.  Objective   Blood pressure (!) 88/58, pulse (!) 122, temperature (!) 94.3 F (34.6 C), resp. rate (!) 27, height 6\' 1"  (1.854 m), weight 126 kg, SpO2 (!) 88 %. CVP:  [3 mmHg-7 mmHg] 7 mmHg  Vent Mode: PRVC FiO2 (%):  [80 %-100 %] 100 % Set Rate:  [20 bmp-25 bmp] 25 bmp Vt Set:  [480 mL] 480 mL PEEP:  [12 cmH20] 12 cmH20 Plateau Pressure:  [19 cmH20-26 cmH20] 19 cmH20   Intake/Output Summary (Last 24 hours) at Dec 11, 2019 0813 Last data filed at 2019/12/11 0600 Gross per 24 hour  Intake  2799.33 ml  Output 2541 ml  Net 258.33 ml   Filed Weights   11/12/19 0406 11/13/19 0337 December 11, 2019 0434  Weight: 123.9 kg 125.2 kg 126 kg    Examination: GEN: ill appearing man on vent HEENT: ETT in place, minimal secretions CV: Tachycardic, irregular, ext lukewarm PULM: +accessory muscles, scattered rhonci, air hungry GI: Hypoactive BS EXT: 1+ edema NEURO: GCS3 PSYCH: RASS -5 SKIN: No rashes  CXR small R PTX stable   Resolved Hospital Problem list     Assessment & Plan:  COVID19 now with multiorgan failure (bone marrow, GI, renal, lung, CNS, heart, kidney) - Calling family in - Continue aggressive care in interim - He will not survive this illness, allowing them to say goodbye today   This patient is critically ill with multiple organ system failure; which, requires frequent high complexity decision making, assessment, support, evaluation, and titration of therapies. This was completed through the application of advanced monitoring technologies and extensive interpretation of multiple databases. During this encounter critical care time was devoted to patient care services described in this note for 36 minutes.  Candee Furbish, DO [facility] 2019/12/11 8:13 AM

## 2019-12-10 NOTE — Procedures (Signed)
Called to bedside for VTi and VTe mismatch, audible leaking around tube.   Unclear why as pilot balloon inflated.  ET tube no longer transparent, interior appears coated with secretions.  Changed over bougie without incident.  Transient drop in O2 to 88.  Improved.   No further leaking sounds.    ET tube inspected, no leak in balloon.

## 2019-12-10 NOTE — Progress Notes (Signed)
Endotracheal tube was exchanged out by CCM MD. Patients vitals were stable throughout. Patient tolerated procedure well. Patient is now receiving proper tidals volumes with new ETT.

## 2019-12-10 NOTE — Discharge Summary (Signed)
Date of Admission: 10/28/2019 Date of Death: December 08, 2019 Diagnoses:  COVID ARDS eventually leading to multiorgan failure Renal Failure Upper GI bleed due to gastric ulcer  Hospital Course: Patient was admitted to an outside hospital on 1/13 with 4 day history of fatigue, malaise.  He progressively required more respiratory support ultimately ending up intubated on 11/04/19 and sent to [facility].  After arrival here, he developed significant GI bleed and found to have gastric ulcer with adherent clot not intervened upon.  This bleed did stabilize but he subsequently developed renal failure requiring initiation of renal replacement therapy.  Throughout his ventilation and oxygenation progressively deteriorated and he eventually went into irreversible multiorgan failure on 2/2-02-27-21.  After discussion was family who came to bedside, we allowed Mr. Gassett to pass in peace.  Erskine Emery MD PCCM

## 2019-12-10 NOTE — Consult Note (Signed)
[facility] Nurse Consult Note: Reason for Consult:DTPI to right ischial tuberosity Wound type:pressure Pressure Injury POA: No Measurement: 3.5cm x 5cm area of purple discoloration that does not blanch Wound bed:N/A Drainage (amount, consistency, odor) None Periwound: Intact, dry Dressing procedure/placement/frequency: Patient is critically ill, undergoing CVVHD, has an indwelling bowel management system for fecal incontinence and is intubated.  Area over right ischial tuberosity will be protected with silicone foam and assessed each shift. Heels are intact and will be floated in [employer].  The sacral foam dressing to the sacrum is lifted to reveal and intact sacrum.  [place] nursing team will follow and assess every 7-10 days, and will remain available to this patient, the nursing and medical teams. Please reconsult if needed in betwen visits.  Thanks, Maudie Flakes, MSN, RN, [place], Arther Abbott  Pager# [phone]

## 2019-12-10 NOTE — Progress Notes (Signed)
[place] Progress Note Patient Name: Shigeo Baugh DOB: 1946/04/10 MRN: [id number]   Date of Service  12-02-2019  HPI/Events of Note  Hypotension - BP = 77/48 with MAP = 58.   eICU Interventions  Will order: 1. NaHCO3 IV 100 meq IV now.  2. Increase D5 NaHCO3 IV infusion to 125 mL/hour. 3. Increase ceiling on Norepinephrine IV infusion to 70 mcg/min.      Intervention Category Major Interventions: Hypotension - evaluation and management  Fay Swider Cornelia Copa 12-02-19, 12:49 AM

## 2019-12-10 DEATH — deceased

## 2020-07-05 IMAGING — DX DG CHEST 1V PORT
1 series · 1 of 1 positions shown · non-contrast
Comparison: Chest radiograph 11/05/2019

CLINICAL DATA: ET tube.

EXAM:
PORTABLE CHEST 1 VIEW

[chest ap]
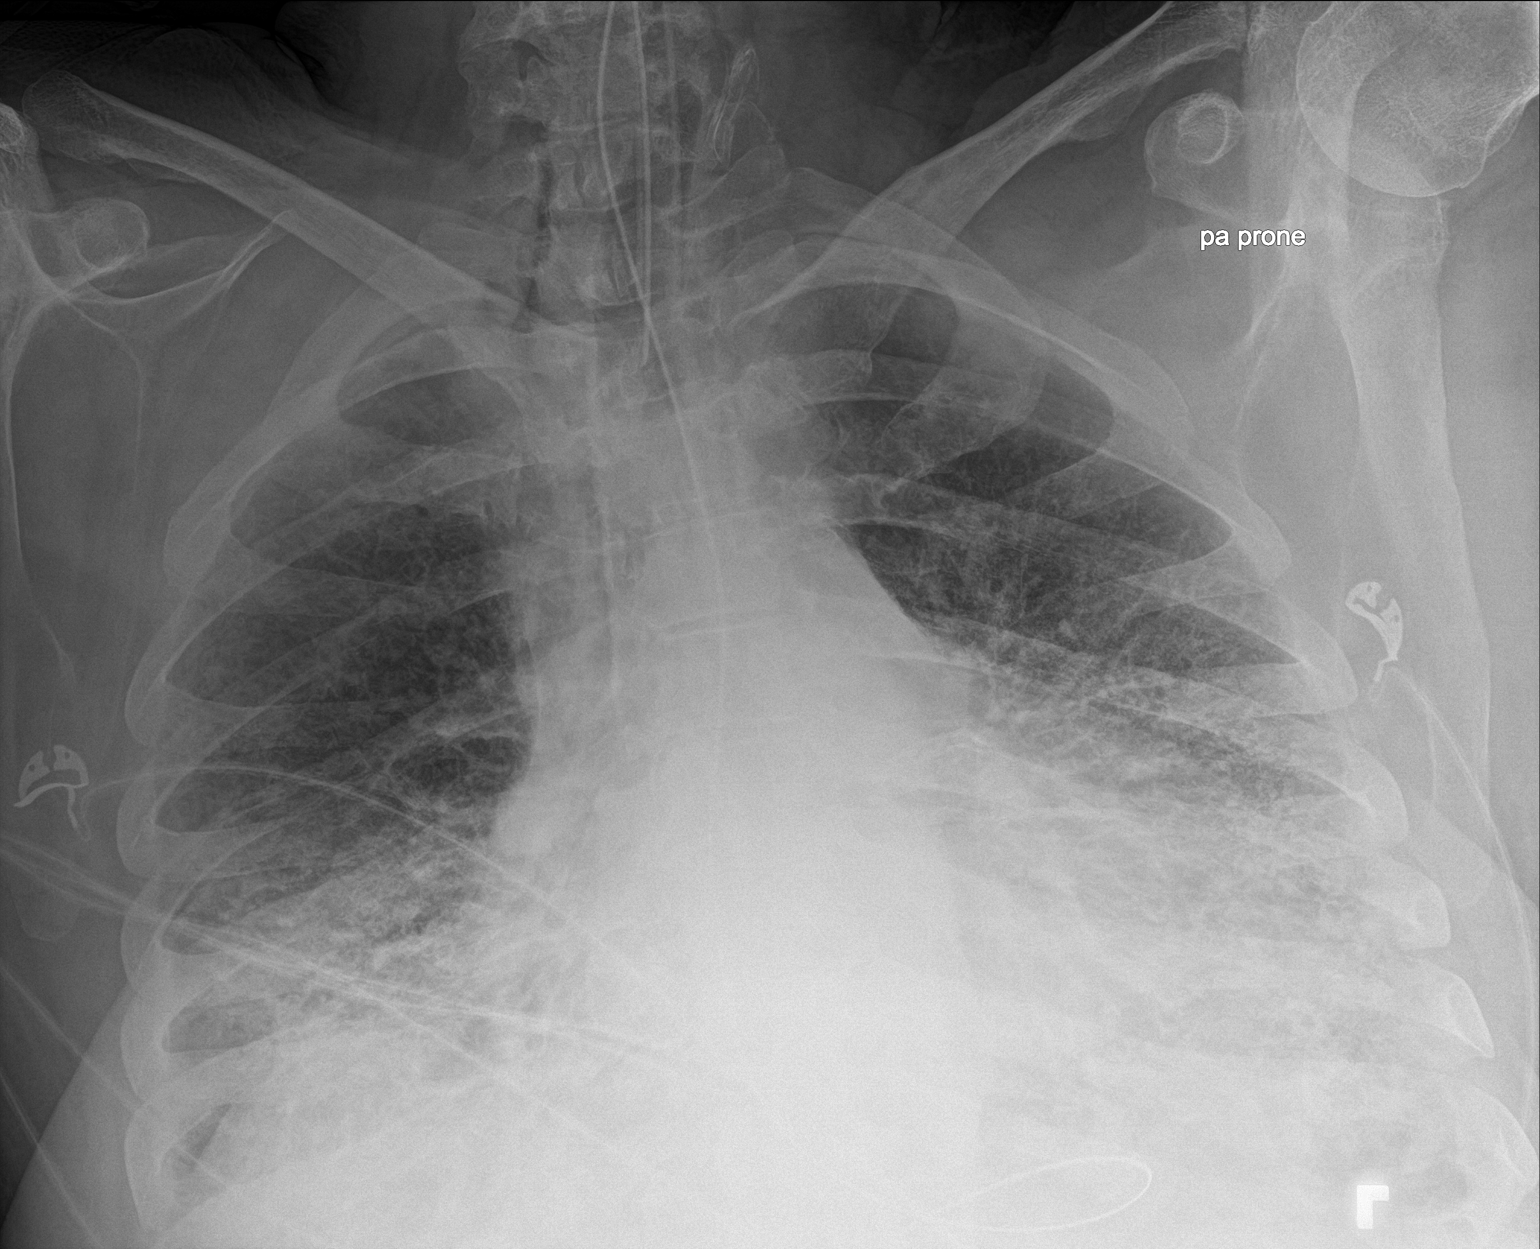

[1 of 1 positions shown; findings below may reference images not displayed]

FINDINGS: The carina is poorly delineated on the current examination. The
ET-tube terminates at the level of the clavicular heads. An enteric
tube passes below the level of the left hemidiaphragm with tip
excluded from the field of view.

Unchanged bilateral airspace opacities within mid to lower lung
predominance. No definite pleural effusion. No evidence of
pneumothorax. No acute bony abnormality.
IMPRESSION: Support apparatus as described.

Bilateral airspace disease with a mid to lower lung predominance,
similar to prior examination and likely reflecting pneumonia.

## 2020-07-07 IMAGING — DX DG CHEST 1V PORT
1 series · 1 of 1 positions shown · non-contrast
Comparison: November 07, 2019

CLINICAL DATA: Hypoxia

EXAM:
PORTABLE CHEST 1 VIEW

[chest ap]
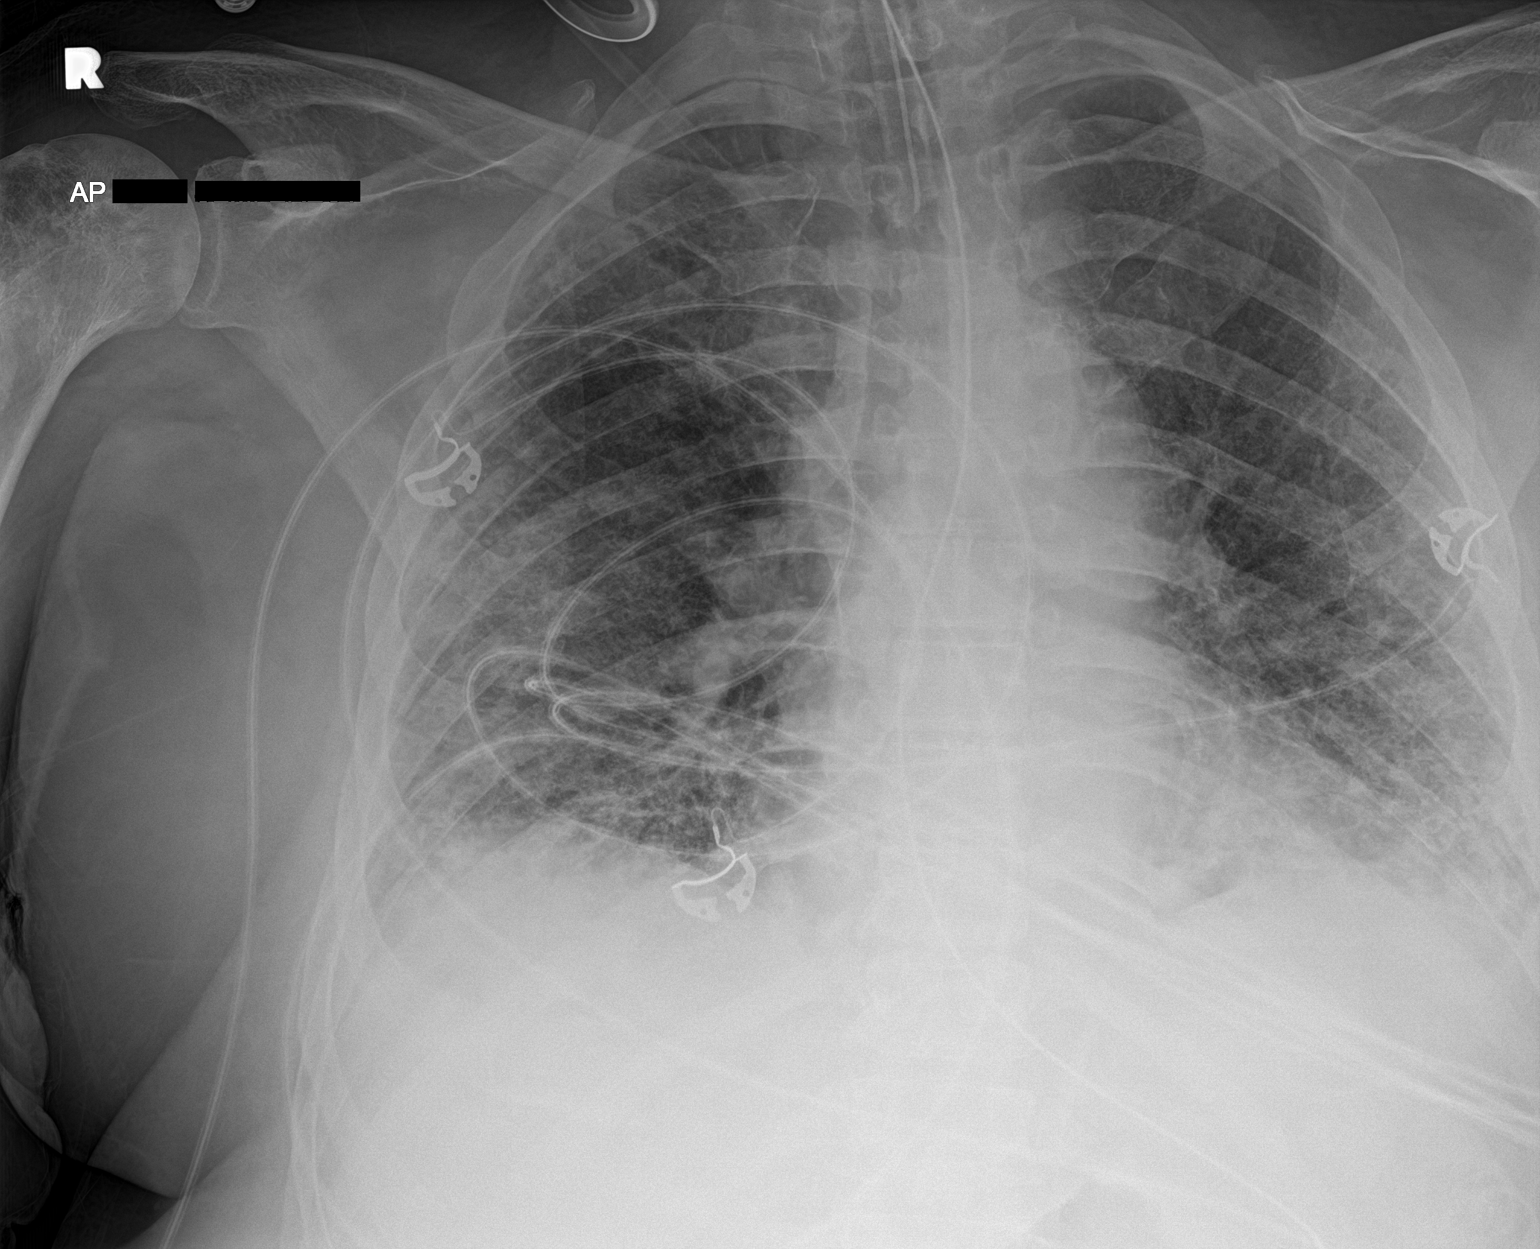

[1 of 1 positions shown; findings below may reference images not displayed]

FINDINGS: Endotracheal tube tip is 6.9 cm above the carina. Nasogastric tube
tip and side port are below the diaphragm. No pneumothorax. There is
persistent patchy airspace disease in each mid and lower lung zone
with small pleural effusions. No new opacity evident. Heart is upper
normal in size with pulmonary vascularity normal. No adenopathy. No
bone lesions.
IMPRESSION: Tube positions as described without pneumothorax. Airspace disease
in the mid lower lung zones is essentially stable. Stable small
pleural effusions bilaterally. Stable cardiac silhouette.

## 2020-07-13 IMAGING — DX DG CHEST 1V PORT
1 series · 1 of 1 positions shown · non-contrast
Comparison: Multiple recent films from yesterday.

CLINICAL DATA: Respiratory failure.

EXAM:
PORTABLE CHEST 1 VIEW

[chest]
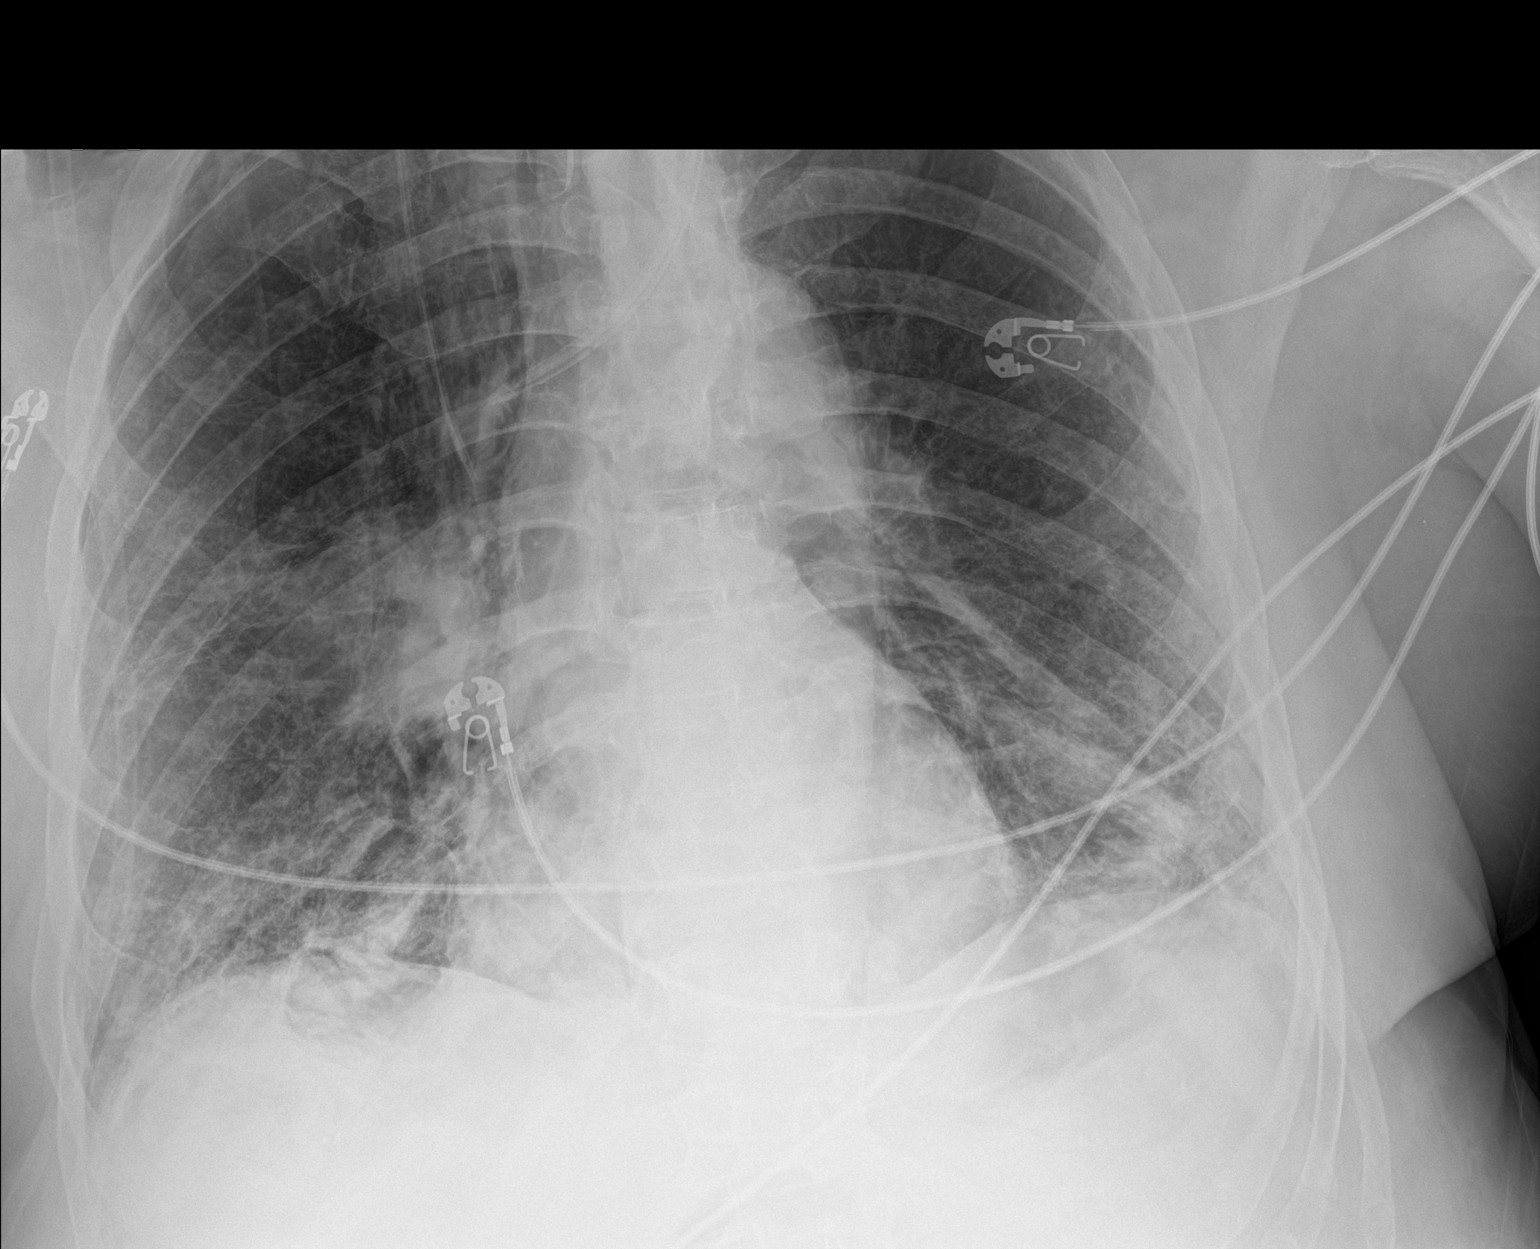

[1 of 1 positions shown; findings below may reference images not displayed]

FINDINGS: The endotracheal tube is 7 cm above the carina. The right IJ central
venous catheter is stable. The left IJ central venous catheter is
stable.

Stable small right apical pneumothorax and right-sided subcutaneous
emphysema. Patchy bilateral infiltrates consistent with known COVID
pneumonia. Unusual air collection at the right lung base could be a
loculated part of the pneumothorax. No pleural effusions.
IMPRESSION: 1. Stable support apparatus.
2. Stable small right apical pneumothorax and right-sided
subcutaneous emphysema.
3. Stable bilateral infiltrates.
